# Patient Record
Sex: Female | Born: 1995 | Race: White | Hispanic: No | Marital: Married | Smoking: Never smoker
Health system: Southern US, Community
[De-identification: ages and names within clinical notes are randomized; demographics above are authoritative.]

## PROBLEM LIST (undated history)

## (undated) HISTORY — PX: NO PAST SURGERIES: SHX2092

## (undated) SURGERY — Surgical Case
Anesthesia: *Unknown

---

## 2016-04-21 ENCOUNTER — Ambulatory Visit: Admission: EM | Admit: 2016-04-21 | Discharge: 2016-04-21 | Discharge status: Absent without leave | Payer: Self-pay

## 2016-04-22 ENCOUNTER — Ambulatory Visit
Admission: EM | Admit: 2016-04-22 | Discharge: 2016-04-22 | Disposition: A | Discharge status: Home / Self Care | Payer: Self-pay | Attending: Family Medicine | Admitting: Family Medicine

## 2016-04-22 ENCOUNTER — Encounter: Payer: Self-pay | Admitting: *Deleted

## 2016-04-22 DIAGNOSIS — S61219A Laceration without foreign body of unspecified finger without damage to nail, initial encounter: Secondary | ICD-10-CM

## 2016-04-22 MED ORDER — TETANUS-DIPHTH-ACELL PERTUSSIS 5-2.5-18.5 LF-MCG/0.5 IM SUSP
0.5000 mL | Freq: Once | INTRAMUSCULAR | Status: AC
  Administered 2016-04-22: 0.5 mL via INTRAMUSCULAR

## 2016-04-22 NOTE — Discharge Instructions (Signed)
Stitches, Staples, or Adhesive Wound Closure  °Health care providers use stitches (sutures), staples, and certain glue (skin adhesives) to hold skin together while it heals (wound closure). You may need this treatment after you have surgery or if you cut your skin accidentally. These methods help your skin to heal more quickly and make it less likely that you will have a scar. A wound may take several months to heal completely.  °The type of wound you have determines when your wound gets closed. In most cases, the wound is closed as soon as possible (primary skin closure). Sometimes, closure is delayed so the wound can be cleaned and allowed to heal naturally. This reduces the chance of infection. Delayed closure may be needed if your wound:  °Is caused by a bite.  °Happened more than 6 hours ago.  °Involves loss of skin or the tissues under the skin.  °Has dirt or debris in it that cannot be removed.  °Is infected. °WHAT ARE THE DIFFERENT KINDS OF WOUND CLOSURES?  °There are many options for wound closure. The one that your health care provider uses depends on how deep and how large your wound is.  °Adhesive Glue  °To use this type of glue to close a wound, your health care provider holds the edges of the wound together and paints the glue on the surface of your skin. You may need more than one layer of glue. Then the wound may be covered with a light bandage (dressing).  °This type of skin closure may be used for small wounds that are not deep (superficial). Using glue for wound closure is less painful than other methods. It does not require a medicine that numbs the area (local anesthetic). This method also leaves nothing to be removed. Adhesive glue is often used for children and on facial wounds.  °Adhesive glue cannot be used for wounds that are deep, uneven, or bleeding. It is not used inside of a wound.  °Adhesive Strips  °These strips are made of sticky (adhesive), porous paper. They are applied across your  skin edges like a regular adhesive bandage. You leave them on until they fall off.  °Adhesive strips may be used to close very superficial wounds. They may also be used along with sutures to improve the closure of your skin edges.  °Sutures  °Sutures are the oldest method of wound closure. Sutures can be made from natural substances, such as silk, or from synthetic materials, such as nylon and steel. They can be made from a material that your body can break down as your wound heals (absorbable), or they can be made from a material that needs to be removed from your skin (nonabsorbable). They come in many different strengths and sizes.  °Your health care provider attaches the sutures to a steel needle on one end. Sutures can be passed through your skin, or through the tissues beneath your skin. Then they are tied and cut. Your skin edges may be closed in one continuous stitch or in separate stitches.  °Sutures are strong and can be used for all kinds of wounds. Absorbable sutures may be used to close tissues under the skin. The disadvantage of sutures is that they may cause skin reactions that lead to infection. Nonabsorbable sutures need to be removed.  °Staples  °When surgical staples are used to close a wound, the edges of your skin on both sides of the wound are brought close together. A staple is placed across the wound, and   an instrument secures the edges together. Staples are often used to close surgical cuts (incisions).  °Staples are faster to use than sutures, and they cause less skin reaction. Staples need to be removed using a tool that bends the staples away from your skin.  °HOW DO I CARE FOR MY WOUND CLOSURE?  °Take medicines only as directed by your health care provider.  °If you were prescribed an antibiotic medicine for your wound, finish it all even if you start to feel better.  °Use ointments or creams only as directed by your health care provider.  °Wash your hands with soap and water before and  after touching your wound.  °Do not soak your wound in water. Do not take baths, swim, or use a hot tub until your health care provider approves.  °Ask your health care provider when you can start showering. Cover your wound if directed by your health care provider.  °Do not take out your own sutures or staples.  °Do not pick at your wound. Picking can cause an infection.  °Keep all follow-up visits as directed by your health care provider. This is important. °HOW LONG WILL I HAVE MY WOUND CLOSURE?  °Leave adhesive glue on your skin until the glue peels away.  °Leave adhesive strips on your skin until the strips fall off.  °Absorbable sutures will dissolve within several days.  °Nonabsorbable sutures and staples must be removed. The location of the wound will determine how long they stay in. This can range from several days to a couple of weeks. °WHEN SHOULD I SEEK HELP FOR MY WOUND CLOSURE?  °Contact your health care provider if:  °You have a fever.  °You have chills.  °You have drainage, redness, swelling, or pain at your wound.  °There is a bad smell coming from your wound.  °The skin edges of your wound start to separate after your sutures have been removed.  °Your wound becomes thick, raised, and darker in color after your sutures come out (scarring). °This information is not intended to replace advice given to you by your health care provider. Make sure you discuss any questions you have with your health care provider.  °Document Released: 08/02/2001 Document Revised: 11/28/2014 Document Reviewed: 04/16/2014  °Elsevier Interactive Patient Education ©2016 [employer]  ° °

## 2016-04-22 NOTE — ED Notes (Signed)
1/2" laceration to tip right middle finger, occurred while opening a can of dog food yesterday evening. Bleeding controlled, no edema.

## 2016-04-22 NOTE — ED Provider Notes (Signed)
CSN: [id number]     Arrival date & time 04/22/16  0801 History   First MD Initiated Contact with Patient 04/22/16 [phone]     Chief Complaint  Patient presents with  . 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 She is cleaning it extensively prior to coming in but presented today because of the need of a tetanus injection.  History reviewed. No pertinent past medical history. History reviewed. No pertinent past surgical history. History reviewed. No pertinent family history. Social History  Substance Use Topics  . Smoking status: Never Smoker   . Smokeless tobacco: None  . Alcohol Use: No   OB History    No data available     Review of Systems  Constitutional: Negative for fever, chills, activity change and fatigue.  Skin: Positive for wound.  All other systems reviewed and are negative.   Allergies  Sulfa antibiotics and Penicillins  Home Medications   Prior to Admission medications   Medication Sig Start Date End Date Taking? Authorizing Provider  fluticasone (FLONASE) 50 MCG/ACT nasal spray Place into both nostrils daily.   Yes Historical Provider, MD  norgestrel-ethinyl estradiol (LO/OVRAL,CRYSELLE) 0.3-30 MG-MCG tablet Take 1 tablet by mouth daily.   Yes Historical Provider, MD   Meds Ordered and Administered this Visit   Medications  Tdap (BOOSTRIX) injection 0.5 mL (0.5 mLs Intramuscular Given 04/22/16 0829)    BP 118/76 mmHg  Pulse 101  Temp(Src) 98.5 F (36.9 C) (Oral)  Resp 16  Ht 5\' 6"  (1.676 m)  Wt 178 lb (80.74 kg)  BMI 28.74 kg/m2  SpO2 100%  LMP 04/22/2016 (Approximate) No data found.   Physical Exam  Constitutional: She is oriented to person, place, and time. She appears  well-developed and well-nourished. No distress.  HENT:  Head: Normocephalic and atraumatic.  Eyes: Conjunctivae are normal. Pupils are equal, round, and reactive to light.  Musculoskeletal: Normal range of motion. She exhibits no edema or tenderness.  Neurological: She is alert and oriented to person, place, and time.  Skin: Skin is warm and dry. She is not diaphoretic.  Examination of the right dominant hand shows a small 5 mm transversely oriented laceration over the distal pad not involving the fingernail which extends into the dermis exposing a small amount of subcutaneous tissue. Finger range of motion is normal and full. Sensation to light touch is intact. There is no drainage no bleeding no evidence of early infection present.  Psychiatric: She has a normal mood and affect. Her behavior is normal. Judgment and thought content normal.  Nursing note and vitals reviewed.   ED Course  Procedures (including critical care time)  Labs Review Labs Reviewed - No data to display  Imaging Review No results found.   Visual Acuity Review  Right Eye Distance:   Left Eye Distance:   Bilateral Distance:    Right Eye Near:   Left Eye Near:    Bilateral Near:     Medications  Tdap (BOOSTRIX) injection 0.5 mL (0.5 mLs Intramuscular Given 04/22/16 0829)   After thorough soaking and cleansing of the finger it was dried thoroughly and Dermabond was applied to the laceration. Her crit was applied and allowed to dry in between coats. Patient was instructed in signs and symptoms of infection and she'll return  to the clinic immediately if any occur. I have asked her to allow the glue to fall off normally and not to pick at it.   MDM   1. Laceration of finger of right hand, initial encounter    Plan: 1. Diagnosis reviewed with patient 2. rx as per orders; risks, benefits, potential side effects reviewed with patient 3. Recommend supportive treatment with Appropriate wound care. Information was  provided to the patient. 4. F/u prn if symptoms worsen or don't improve     Lutricia Feil, PA-C 04/22/16 0917  Lutricia Feil, PA-C 04/22/16 [phone]

## 2018-02-19 ENCOUNTER — Encounter: Payer: Self-pay | Admitting: Obstetrics & Gynecology

## 2018-02-19 ENCOUNTER — Ambulatory Visit: Payer: BLUE CROSS/BLUE SHIELD | Admitting: Obstetrics & Gynecology

## 2018-02-19 VITALS — BP 114/76 | HR 90 | Ht 66.0 in | Wt 195.0 lb

## 2018-02-19 DIAGNOSIS — N946 Dysmenorrhea, unspecified: Secondary | ICD-10-CM

## 2018-02-19 DIAGNOSIS — N921 Excessive and frequent menstruation with irregular cycle: Secondary | ICD-10-CM | POA: Diagnosis not present

## 2018-02-19 MED ORDER — MEGESTROL ACETATE 40 MG PO TABS: ORAL_TABLET | ORAL | 3 refills | Status: DC

## 2018-02-19 NOTE — Progress Notes (Signed)
Chief Complaint  Patient presents with  . Menstrual Problem    heavy periods and cramps      22 y.o. G0P0000 Patient's last menstrual period was 01/21/2018. The current method of family planning is OCP (estrogen/progesterone).  Outpatient Encounter Medications as of 02/19/2018  Medication Sig  . fluticasone (FLONASE) 50 MCG/ACT nasal spray Place into both nostrils daily.  . [DISCONTINUED] norgestrel-ethinyl estradiol (LO/OVRAL,CRYSELLE) 0.3-30 MG-MCG tablet Take 1 tablet by mouth daily.  . megestrol (MEGACE) 40 MG tablet 3 tablets a day for 5 days, 2 tablets a day for 5 days then 1 tablet daily   No facility-administered encounter medications on file as of 02/19/2018.     Subjective Several year history of heavy bleeding, soils, clots last 7+ days Heavy cramping as well OTCs do not work well anymore History reviewed. No pertinent past medical history.  History reviewed. No pertinent surgical history.  OB History    Gravida  0   Para  0   Term  0   Preterm  0   AB  0   Living  0     SAB  0   TAB  0   Ectopic  0   Multiple  0   Live Births  0           Allergies  Allergen Reactions  . Sulfa Antibiotics Anaphylaxis  . Penicillins Hives  . Latex Itching and Rash    Social History   Socioeconomic History  . Marital status: Single    Spouse name: Not on file  . Number of children: Not on file  . Years of education: Not on file  . Highest education level: Not on file  Occupational History  . Not on file  Social Needs  . Financial resource strain: Not on file  . Food insecurity:    Worry: Not on file    Inability: Not on file  . Transportation needs:    Medical: Not on file    Non-medical: Not on file  Tobacco Use  . Smoking status: Never Smoker  . Smokeless tobacco: Never Used  Substance and Sexual Activity  . Alcohol use: Yes    Comment: occ  . Drug use: Never  . Sexual activity: Yes    Birth control/protection: None  Lifestyle   . Physical activity:    Days per week: Not on file    Minutes per session: Not on file  . Stress: Not on file  Relationships  . Social connections:    Talks on phone: Not on file    Gets together: Not on file    Attends religious service: Not on file    Active member of club or organization: Not on file    Attends meetings of clubs or organizations: Not on file    Relationship status: Not on file  Other Topics Concern  . Not on file  Social History Narrative  . Not on file    Family History  Problem Relation Age of Onset  . Stroke Paternal Grandfather   . Hypertension Paternal Grandfather   . Cancer Paternal Grandmother        lung  . Breast cancer Paternal Grandmother   . Cancer Maternal Grandfather        renal  . Hypertension Maternal Grandfather   . Hypertension Father     Medications:       Current Outpatient Medications:  .  fluticasone (FLONASE) 50 MCG/ACT nasal spray, Place into  both nostrils daily., Disp: , Rfl:  .  desogestrel-ethinyl estradiol (APRI,EMOQUETTE,SOLIA) 0.15-30 MG-MCG tablet, Take 1 tablet by mouth daily., Disp: 1 Package, Rfl: 11 .  megestrol (MEGACE) 40 MG tablet, 3 tablets a day for 5 days, 2 tablets a day for 5 days then 1 tablet daily, Disp: 45 tablet, Rfl: 3 .  Norethindrone-Ethinyl Estradiol-Fe Biphas (LO LOESTRIN FE) 1 MG-10 MCG / 10 MCG tablet, Take 1 tablet by mouth daily., Disp: 1 Package, Rfl: 11  Objective Blood pressure 114/76, pulse 90, height 5\' 6"  (1.676 m), weight 195 lb (88.5 kg), last menstrual period 01/21/2018.  General WDWN female NAD Vulva:  normal appearing vulva with no masses, tenderness or lesions Vagina:  normal mucosa, no discharge Cervix:  no cervical motion tenderness and no lesions Uterus:  normal size, contour, position, consistency, mobility, non-tender Adnexa: ovaries:present,  normal adnexa in size, nontender and no masses   Pertinent ROS No burning with urination, frequency or urgency No nausea,  vomiting or diarrhea Nor fever chills or other constitutional symptoms'   Labs or studies     Impression Diagnoses this Encounter::   ICD-10-CM   1. Menometrorrhagia N92.1 US PELVIS TRANSVANGINAL NON-OB (TV ONLY)    US Pelvis Complete  2. Dysmenorrhea N94.6 US PELVIS TRANSVANGINAL NON-OB (TV ONLY)    US Pelvis Complete    Established relevant diagnosis(es):   Plan/Recommendations: Meds ordered this encounter  Medications  . megestrol (MEGACE) 40 MG tablet    Sig: 3 tablets a day for 5 days, 2 tablets a day for 5 days then 1 tablet daily    Dispense:  45 tablet    Refill:  3    Labs or Scans Ordered: Orders Placed This Encounter  Procedures  . US PELVIS TRANSVANGINAL NON-OB (TV ONLY)  . US Pelvis Complete    Management:: Megestrol algorithm Pelvic sonogram to delineate anatomy endometrium  Follow up Return in about 1 month (around 03/21/2018) for GYN sono, Follow up, with Dr Despina HiddenEure.      All questions were answered.

## 2018-03-19 ENCOUNTER — Ambulatory Visit: Payer: BLUE CROSS/BLUE SHIELD | Admitting: Obstetrics & Gynecology

## 2018-03-19 ENCOUNTER — Ambulatory Visit (INDEPENDENT_AMBULATORY_CARE_PROVIDER_SITE_OTHER): Payer: BLUE CROSS/BLUE SHIELD

## 2018-03-19 ENCOUNTER — Encounter: Payer: Self-pay | Admitting: Obstetrics & Gynecology

## 2018-03-19 VITALS — BP 118/80 | HR 96 | Ht 66.0 in | Wt 192.0 lb

## 2018-03-19 DIAGNOSIS — N921 Excessive and frequent menstruation with irregular cycle: Secondary | ICD-10-CM | POA: Diagnosis not present

## 2018-03-19 DIAGNOSIS — N946 Dysmenorrhea, unspecified: Secondary | ICD-10-CM

## 2018-03-19 MED ORDER — DESOGESTREL-ETHINYL ESTRADIOL 0.15-30 MG-MCG PO TABS: 1.0000 | ORAL_TABLET | Freq: Every day | ORAL | 11 refills | Status: DC

## 2018-03-19 NOTE — Progress Notes (Signed)
Follow up appointment for results  Chief Complaint  Patient presents with  . discuss [place]    Blood pressure 118/80, pulse 96, height  (1.676 m), weight 192 lb (87.1 kg), last menstrual period 01/23/2018.  US Pelvis Transvanginal Non-ob (tv Only)  Result Date: 03/19/2018 GYNECOLOGIC SONOGRAM Lamis Jocelyn Baldwin is a 22 y.o. G0P0000 LMP 01/23/2018,she is here for a pelvic sonogram for menometrorrhagia and dysmenorrhea. Uterus                      7.7 x 3.9 x 5 cm, vol 78 ml, homogeneous anteverted uterus,wnl Endometrium          5.8 mm, symmetrical, wnl Right ovary             3.4 x 2.7 x 3.6 cm, wnl Left ovary                3 x 2.5 x 1.2 cm, wnl No free fluid Technician Comments: PELVIC US TA/TV: homogeneous anteverted uterus,wnl,normal ovaries bilat,ovaries appear mobile,EEC 5.8 mm,no free fluid,no pain during ultrasound [employer] 03/19/2018 10:01 AM Clinical Impression and recommendations: I have reviewed the sonogram results above, combined with the patient's current clinical course, below are my impressions and any appropriate recommendations for management based on the sonographic findings. Normal uterus and endometrium Normal ovaries Lazaro Arms 03/19/2018 10:30 AM   US Pelvis Complete  Result Date: 03/19/2018 GYNECOLOGIC SONOGRAM Jocelyn Baldwin is a 22 y.o. G0P0000 LMP 01/23/2018,she is here for a pelvic sonogram for menometrorrhagia and dysmenorrhea. Uterus                      7.7 x 3.9 x 5 cm, vol 78 ml, homogeneous anteverted uterus,wnl Endometrium          5.8 mm, symmetrical, wnl Right ovary             3.4 x 2.7 x 3.6 cm, wnl Left ovary                3 x 2.5 x 1.2 cm, wnl No free fluid Technician Comments: PELVIC US TA/TV: homogeneous anteverted uterus,wnl,normal ovaries bilat,ovaries appear mobile,EEC 5.8 mm,no free fluid,no pain during ultrasound [employer] 03/19/2018 10:01 AM Clinical Impression and recommendations: I have reviewed the sonogram results above, combined with the  patient's current clinical course, below are my impressions and any appropriate recommendations for management based on the sonographic findings. Normal uterus and endometrium Normal ovaries Lazaro Arms 03/19/2018 10:30 AM      MEDS ordered this encounter: Meds ordered this encounter  Medications  . desogestrel-ethinyl estradiol (APRI,EMOQUETTE,SOLIA) 0.15-30 MG-MCG tablet    Sig: Take 1 tablet by mouth daily.    Dispense:  1 Package    Refill:  11    Orders for this encounter: No orders of the defined types were placed in this encounter.   Impression: Menometrorrhagia  Dysmenorrhea    Plan: Continue megestrol for 2 more months And will begin 30 mcg days Ogestrel birth control pill for cycle control Demitria is considering trying to start getting pregnant next year and I have instructed her she may need ovulation induction and she should have a quick trigger for [place] to use Clomid for her If she has problems in the meantime she will let me know  Follow Up: Return in about 1 year (around 03/20/2019) for yearly, with Dr Despina Hidden.       Face to face time:  15 minutes  Greater than 50% of the visit time was spent in counseling and coordination of care with the patient.  The summary and outline of the counseling and care coordination is summarized in the note above.   All questions were answered.  History reviewed. No pertinent past medical history.  History reviewed. No pertinent surgical history.  OB History    Gravida  0   Para  0   Term  0   Preterm  0   AB  0   Living  0     SAB  0   TAB  0   Ectopic  0   Multiple  0   Live Births  0           Allergies  Allergen Reactions  . Sulfa Antibiotics Anaphylaxis  . Penicillins Hives  . Latex Itching and Rash    Social History   Socioeconomic History  . Marital status: Single    Spouse name: Not on file  . Number of children: Not on file  . Years of education: Not on file  . Highest  education level: Not on file  Occupational History  . Not on file  Social Needs  . Financial resource strain: Not on file  . Food insecurity:    Worry: Not on file    Inability: Not on file  . Transportation needs:    Medical: Not on file    Non-medical: Not on file  Tobacco Use  . Smoking status: Never Smoker  . Smokeless tobacco: Never Used  Substance and Sexual Activity  . Alcohol use: Yes    Comment: occ  . Drug use: Never  . Sexual activity: Yes    Birth control/protection: None  Lifestyle  . Physical activity:    Days per week: Not on file    Minutes per session: Not on file  . Stress: Not on file  Relationships  . Social connections:    Talks on phone: Not on file    Gets together: Not on file    Attends religious service: Not on file    Active member of club or organization: Not on file    Attends meetings of clubs or organizations: Not on file    Relationship status: Not on file  Other Topics Concern  . Not on file  Social History Narrative  . Not on file    Family History  Problem Relation Age of Onset  . Stroke Paternal Grandfather   . Hypertension Paternal Grandfather   . Cancer Paternal Grandmother        lung  . Breast cancer Paternal Grandmother   . Cancer Maternal Grandfather        renal  . Hypertension Maternal Grandfather   . Hypertension Father

## 2018-03-19 NOTE — Progress Notes (Signed)
PELVIC US TA/TV: homogeneous anteverted uterus,wnl,normal ovaries bilat,ovaries appear mobile,EEC 5.8 mm,no free fluid,no pain during ultrasound

## 2018-04-09 ENCOUNTER — Telehealth: Payer: Self-pay | Admitting: Obstetrics & Gynecology

## 2018-04-09 NOTE — Telephone Encounter (Signed)
Patient called stating that Dr. Despina Hidden Placed her on a medication for a week to start her period. Pt states that she has not started her period what should she do next. Please contact pt

## 2018-04-09 NOTE — Telephone Encounter (Signed)
Spoke with pt. Pt was started on Megace. Last Megace taken was 04/01/18 to have a period. Pt hasn't started period. Pt is having sex and hasn't taken a pregnancy test. I advised to take a pregnancy test and let us know what result is. Pt voiced understanding. I tried to call pt back to ask if she had started on the birth control pills that was prescribed and voice mail was full so I couldn't leave a message. JSY

## 2018-04-11 ENCOUNTER — Telehealth: Payer: Self-pay | Admitting: Obstetrics & Gynecology

## 2018-04-11 NOTE — Telephone Encounter (Signed)
Mail box full @ 10:49 am. Unable to leave message. JSY

## 2018-04-12 NOTE — Telephone Encounter (Signed)
Spoke with pt. Pt states she started period Monday. Is still bleeding. Taking Megace. I advised to continue med as prescribed and call with any concerns. Pt voiced understanding. JSY

## 2018-05-31 ENCOUNTER — Telehealth: Payer: Self-pay | Admitting: Obstetrics & Gynecology

## 2018-05-31 ENCOUNTER — Telehealth: Payer: Self-pay | Admitting: *Deleted

## 2018-05-31 MED ORDER — NORETHIN-ETH ESTRAD-FE BIPHAS 1 MG-10 MCG / 10 MCG PO TABS: 1.0000 | ORAL_TABLET | Freq: Every day | ORAL | 11 refills | Status: DC

## 2018-05-31 NOTE — Telephone Encounter (Signed)
Patient called stating that she has been speaking with Tish today, Pt states she forgot to ask tish one more question. Please contact pt

## 2018-05-31 NOTE — Telephone Encounter (Signed)
Patient states she has been taking the BCP for 3 weeks and 5-6 days into taking them, she started having more frequent migraines.  She has a history of migraines and has seen a neurologist in the past, but thinks the BCP are causing them to be more frequent.  She wants to know if dosage needs to be changed. Please advise.

## 2018-05-31 NOTE — Telephone Encounter (Signed)
[name] that [name] was sent.

## 2018-05-31 NOTE — Telephone Encounter (Signed)
See routing note.

## 2018-05-31 NOTE — Telephone Encounter (Addendum)
Patient states insurance states she does not qualify for the [name] discount.  Pharmacy did state if the information was sent through epic, then it may go through. Can new order for [name] be sent with the following info on "note to pharmacy" line?  BIN# [id number] PCN#CN Rx Grp# [id number] RxID# [id number]

## 2018-06-08 ENCOUNTER — Telehealth: Payer: Self-pay | Admitting: Obstetrics & Gynecology

## 2018-06-08 MED ORDER — NORETHIN-ETH ESTRAD-FE BIPHAS 1 MG-10 MCG / 10 MCG PO TABS: 1.0000 | ORAL_TABLET | Freq: Every day | ORAL | 11 refills | Status: DC

## 2019-07-24 ENCOUNTER — Ambulatory Visit (INDEPENDENT_AMBULATORY_CARE_PROVIDER_SITE_OTHER): Payer: Medicaid Other | Admitting: Obstetrics and Gynecology

## 2019-07-24 ENCOUNTER — Other Ambulatory Visit (HOSPITAL_COMMUNITY)
Admission: RE | Admit: 2019-07-24 | Discharge: 2019-07-24 | Disposition: A | Discharge status: Home / Self Care | Payer: Medicaid Other | Source: Ambulatory Visit | Attending: Obstetrics and Gynecology | Admitting: Obstetrics and Gynecology

## 2019-07-24 ENCOUNTER — Other Ambulatory Visit: Payer: Self-pay

## 2019-07-24 ENCOUNTER — Encounter: Payer: Self-pay | Admitting: Obstetrics and Gynecology

## 2019-07-24 VITALS — BP 111/73 | HR 88 | Wt 178.0 lb

## 2019-07-24 DIAGNOSIS — Z124 Encounter for screening for malignant neoplasm of cervix: Secondary | ICD-10-CM

## 2019-07-24 DIAGNOSIS — Z3401 Encounter for supervision of normal first pregnancy, first trimester: Secondary | ICD-10-CM | POA: Diagnosis not present

## 2019-07-24 DIAGNOSIS — Z113 Encounter for screening for infections with a predominantly sexual mode of transmission: Secondary | ICD-10-CM | POA: Insufficient documentation

## 2019-07-24 DIAGNOSIS — N912 Amenorrhea, unspecified: Secondary | ICD-10-CM

## 2019-07-24 DIAGNOSIS — Z3201 Encounter for pregnancy test, result positive: Secondary | ICD-10-CM

## 2019-07-24 DIAGNOSIS — Z3689 Encounter for other specified antenatal screening: Secondary | ICD-10-CM

## 2019-07-24 DIAGNOSIS — Z3A01 Less than 8 weeks gestation of pregnancy: Secondary | ICD-10-CM | POA: Diagnosis not present

## 2019-07-24 DIAGNOSIS — Z3400 Encounter for supervision of normal first pregnancy, unspecified trimester: Secondary | ICD-10-CM

## 2019-07-24 LAB — POCT URINE PREGNANCY: Preg Test, Ur: POSITIVE — AB

## 2019-07-24 NOTE — Progress Notes (Signed)
NOB 

## 2019-07-24 NOTE — Progress Notes (Signed)
New Obstetric Patient H&P    Chief Complaint: "Desires prenatal care"   History of Present Illness: Patient is a 23 y.o. G1P0000 Not Hispanic or Latino female, presents with amenorrhea and positive home pregnancy test. Patient's last menstrual period was 06/15/2019 (exact date). and based on her  LMP, her EDD is Estimated Date of Delivery: 03/21/20 and her EGA is [redacted]w[redacted]d. Cycles are regular monthly. Her last pap smear was within the last year at an outside provider [facility])   She had a urine pregnancy test which was positive about a week ago. Her last menstrual period was normal. Since her LMP she claims she has experienced some mild food aversion. She denies vaginal bleeding. Her past medical history is noncontributory.   Since her LMP, she admits to the use of tobacco products  no She claims she has gained   no pounds since the start of her pregnancy.  There are cats in the home in the home  no  She admits close contact with children on a regular basis  yes (works at [employer]) She has had chicken pox in the past yes She has had Tuberculosis exposures, symptoms, or previously tested positive for TB   no Current or past history of domestic violence. no  Genetic Screening/Teratology Counseling: (Includes patient, baby's father, or anyone in either family with:)   1. Patient's age >/= 11 at [facility]  no 2. Thalassemia ([place], [place], Mediterranean, or Asian background): MCV<80  no 3. Neural tube defect (meningomyelocele, spina bifida, anencephaly)  no 4. Congenital heart defect  no  5. Down syndrome  no 6. Tay-Sachs (Jewish, [place]  no 7. Canavan's Disease  no 8. Sickle cell disease or trait (African)  no  9. Hemophilia or other blood disorders  no  10. Muscular dystrophy  no  11. Cystic fibrosis  no  12. Huntington's Chorea  no  13. Mental retardation/autism  no 14. Other inherited genetic or chromosomal disorder  no 15. Maternal metabolic disorder  (DM, PKU, etc)  no 16. Patient or FOB with a child with a birth defect not listed above no  16a. Patient or FOB with a birth defect themselves no 17. Recurrent pregnancy loss, or stillbirth  no  18. Any medications since LMP other than prenatal vitamins (include vitamins, supplements, OTC meds, drugs, alcohol)  no 19. Any other genetic/environmental exposure to discuss  no  Infection History:   1. Lives with someone with TB or TB exposed  no  2. Patient or partner has history of genital herpes  no 3. Rash or viral illness since LMP  no 4. History of STI (GC, CT, HPV, syphilis, HIV)  no 5. History of recent travel :  no  Other pertinent information:  no     Review of Systems:10 point review of systems negative unless otherwise noted in HPI  Past Medical History:  History reviewed. No pertinent past medical history.  Past Surgical History:  Past Surgical History:  Procedure Laterality Date  . NO PAST SURGERIES      Gynecologic History: Patient's last menstrual period was 06/15/2019 (exact date).  Obstetric History: G1P0000  Family History:  Family History  Problem Relation Age of Onset  . Stroke Paternal Grandfather   . Hypertension Paternal Grandfather   . Cancer Paternal Grandmother        lung  . Breast cancer Paternal Grandmother   . Cancer Maternal Grandfather        renal  . Hypertension  Maternal Grandfather   . Hypertension Father     Social History:  Social History   Socioeconomic History  . Marital status: Single    Spouse name: Not on file  . Number of children: Not on file  . Years of education: Not on file  . Highest education level: Not on file  Occupational History  . Not on file  Social Needs  . Financial resource strain: Not on file  . Food insecurity    Worry: Not on file    Inability: Not on file  . Transportation needs    Medical: Not on file    Non-medical: Not on file  Tobacco Use  . Smoking status: Never Smoker  . Smokeless  tobacco: Never Used  Substance and Sexual Activity  . Alcohol use: Yes    Comment: occ  . Drug use: Never  . Sexual activity: Yes    Birth control/protection: None  Lifestyle  . Physical activity    Days per week: Not on file    Minutes per session: Not on file  . Stress: Not on file  Relationships  . Social Herbalist on phone: Not on file    Gets together: Not on file    Attends religious service: Not on file    Active member of club or organization: Not on file    Attends meetings of clubs or organizations: Not on file    Relationship status: Not on file  . Intimate partner violence    Fear of current or ex partner: Not on file    Emotionally abused: Not on file    Physically abused: Not on file    Forced sexual activity: Not on file  Other Topics Concern  . Not on file  Social History Narrative  . Not on file    Allergies:  Allergies  Allergen Reactions  . Sulfa Antibiotics Anaphylaxis  . Penicillins Hives  . Latex Itching and Rash    Medications: Prior to Admission medications   Medication Sig Start Date End Date Taking? Authorizing Provider  fluticasone (FLONASE) 50 MCG/ACT nasal spray Place into both nostrils daily.   Yes [provider]    Physical Exam Vitals: Blood pressure 111/73, pulse 88, weight 178 lb (80.7 kg), last menstrual period 06/15/2019.  General: NAD HEENT: normocephalic, anicteric Thyroid: no enlargement, no palpable nodules Pulmonary: No increased work of breathing, CTAB Cardiovascular: RRR, distal pulses 2+ Abdomen: NABS, soft, non-tender, non-distended.  Umbilicus without lesions.  No hepatomegaly, splenomegaly or masses palpable. No evidence of hernia  Genitourinary:  External: Normal external female genitalia.  Normal urethral meatus, normal  Bartholin's and Skene's glands.    Vagina: Normal vaginal mucosa, no evidence of prolapse.    Cervix: Grossly normal in appearance, no bleeding  Uterus:  Non-enlarged,  mobile, normal contour.  No CMT  Adnexa: ovaries non-enlarged, no adnexal masses  Rectal: deferred Extremities: no edema, erythema, or tenderness Neurologic: Grossly intact Psychiatric: mood appropriate, affect full   Assessment: 23 y.o. G1P0000 at 105w4d presenting to initiate prenatal care  Plan: 1) Avoid alcoholic beverages. 2) Patient encouraged not to smoke.  3) Discontinue the use of all non-medicinal drugs and chemicals.  4) Take prenatal vitamins daily.  5) Nutrition, food safety (fish, cheese advisories, and high nitrite foods) and exercise discussed. 6) Hospital and practice style discussed with cross coverage system.  7) Genetic Screening, such as with 1st Trimester Screening, cell free fetal DNA, AFP testing, and Ultrasound, as well as  with amniocentesis and CVS as appropriate, is discussed with patient. At the conclusion of today's visit patient requested genetic testing 8) Patient is asked about travel to areas at risk for the Zika virus, and counseled to avoid travel and exposure to mosquitoes or sexual partners who may have themselves been exposed to the virus. Testing is discussed, and will be ordered as appropriate.   Vena AustriaAndreas Dajour [place], MD, Evern CoreFACOG [facility], [facility] 07/24/2019, 9:47 AM

## 2019-07-25 LAB — CERVICOVAGINAL ANCILLARY ONLY
Chlamydia: NEGATIVE
Neisseria Gonorrhea: NEGATIVE

## 2019-07-26 LAB — RPR+RH+ABO+RUB AB+AB SCR+CB...
Antibody Screen: NEGATIVE
HIV Screen 4th Generation wRfx: NONREACTIVE
Hematocrit: 42.6 % (ref 34.0–46.6)
Hemoglobin: 14.3 g/dL (ref 11.1–15.9)
Hepatitis B Surface Ag: NEGATIVE
MCH: 28.5 pg (ref 26.6–33.0)
MCHC: 33.6 g/dL (ref 31.5–35.7)
MCV: 85 fL (ref 79–97)
Platelets: 151 10*3/uL (ref 150–450)
RBC: 5.02 x10E6/uL (ref 3.77–5.28)
RDW: 12.1 % (ref 11.7–15.4)
RPR Ser Ql: NONREACTIVE
Rh Factor: POSITIVE
Rubella Antibodies, IGG: 1.58 index (ref >0.99)
Varicella zoster IgG: 176 index (ref >165)
WBC: 5.3 10*3/uL (ref 3.4–10.8)

## 2019-07-26 LAB — URINE CULTURE

## 2019-08-05 ENCOUNTER — Other Ambulatory Visit: Payer: Self-pay

## 2019-08-05 ENCOUNTER — Ambulatory Visit (INDEPENDENT_AMBULATORY_CARE_PROVIDER_SITE_OTHER): Payer: Medicaid Other | Admitting: Obstetrics and Gynecology

## 2019-08-05 ENCOUNTER — Ambulatory Visit (INDEPENDENT_AMBULATORY_CARE_PROVIDER_SITE_OTHER): Payer: Medicaid Other

## 2019-08-05 ENCOUNTER — Encounter: Payer: Self-pay | Admitting: Obstetrics and Gynecology

## 2019-08-05 VITALS — BP 118/74 | Wt 178.0 lb

## 2019-08-05 DIAGNOSIS — Z3A01 Less than 8 weeks gestation of pregnancy: Secondary | ICD-10-CM | POA: Diagnosis not present

## 2019-08-05 DIAGNOSIS — N8311 Corpus luteum cyst of right ovary: Secondary | ICD-10-CM | POA: Diagnosis not present

## 2019-08-05 DIAGNOSIS — Z3400 Encounter for supervision of normal first pregnancy, unspecified trimester: Secondary | ICD-10-CM | POA: Insufficient documentation

## 2019-08-05 DIAGNOSIS — O3481 Maternal care for other abnormalities of pelvic organs, first trimester: Secondary | ICD-10-CM

## 2019-08-05 DIAGNOSIS — O219 Vomiting of pregnancy, unspecified: Secondary | ICD-10-CM | POA: Insufficient documentation

## 2019-08-05 DIAGNOSIS — Z3689 Encounter for other specified antenatal screening: Secondary | ICD-10-CM

## 2019-08-05 MED ORDER — BONJESTA 20-20 MG PO TBCR: 1.0000 | EXTENDED_RELEASE_TABLET | Freq: Two times a day (BID) | ORAL | 3 refills | Status: DC

## 2019-08-05 NOTE — Progress Notes (Signed)
Routine Prenatal Care Visit  Subjective  Jocelyn NoonSamantha Blalock Baldwin is a 23 y.o. G1P0000 at 4470w2d being seen today for ongoing prenatal care.  She is currently monitored for the following issues for this low-risk pregnancy and has Nausea and vomiting during pregnancy and Supervision of normal first pregnancy, antepartum on their problem list.  ----------------------------------------------------------------------------------- Patient reports nausea without vomiting.  But, is getting worse.    . Vag. Bleeding: None.   . Leaking Fluid denies.  ----------------------------------------------------------------------------------- The following portions of the patient's history were reviewed and updated as appropriate: allergies, current medications, past family history, past medical history, past social history, past surgical history and problem list. Problem list updated.  Objective  Blood pressure 118/74, weight 178 lb (80.7 kg), last menstrual period 06/15/2019. Pregravid weight 178 lb (80.7 kg) Total Weight Gain 0 lb (0 kg) Urinalysis: Urine Protein    Urine Glucose    Fetal Status: Fetal Heart Rate (bpm): Present-normal         General:  Alert, oriented and cooperative. Patient is in no acute distress.  Skin: Skin is warm and dry. No rash noted.   Cardiovascular: Normal heart rate noted  Respiratory: Normal respiratory effort, no problems with respiration noted  Abdomen: Soft, gravid, appropriate for gestational age. Pain/Pressure: Absent     Pelvic:  Cervical exam deferred        Extremities: Normal range of motion.     Mental Status: Normal mood and affect. Normal behavior. Normal judgment and thought content.   Imaging Results [place] Ob Comp Less 14 Wks  Result Date: 08/05/2019 Patient Name: Jocelyn Baldwin DOB: Oct 24, 1996 MRN: [id number] ULTRASOUND REPORT Location: [facility] Date of Service: 08/05/2019 Indications:dating Findings: Mason JimSingleton intrauterine pregnancy is  visualized with a CRL consistent with 44764w3d gestation, giving an (U/S) EDD of 03/20/2020. The (U/S) EDD is consistent with the clinically established EDD of 03/21/2020. FHR: 149 BPM CRL measurement: 12.0 mm Yolk sac is visualized and appears normal and early anatomy is normal. Amnion: visualized and appears normal Right Ovary is normal in appearance. Left Ovary is normal appearance. Corpus luteal cyst:  Right ovary Survey of the adnexa demonstrates no adnexal masses. There is no free peritoneal fluid in the cul de sac. Impression: 1. 31764w3d Viable Singleton Intrauterine pregnancy by U/S. 2. (U/S) EDD is consistent with Clinically established EDD of 03/21/2020. Jocelyn Baldwin, RT There is a viable singleton gestation.  Detailed evaluation of the fetal anatomy is precluded by early gestational age.  It must be noted that a normal ultrasound particular at this early gestational age is unable to rule out fetal aneuploidy, risk of first trimester miscarriage, or anatomic birth defects. Jocelyn MohairStephen Rocklin Soderquist, MD, Jocelyn Baldwin [facility], [facility] 08/05/2019 1:39 PM      Assessment   23 y.o. G1P0000 at 2370w2d by  03/21/2020, by Last Menstrual Period presenting for routine prenatal visit  Plan   Pregnancy#1 Problems (from 06/15/19 to present)    Problem Noted Resolved   Nausea and vomiting during pregnancy 08/05/2019 by Conard NovakJackson, Sascha Baugher D, MD No   Overview Signed 08/05/2019  2:02 PM by Conard NovakJackson, Novis League D, MD    - Samara DeistBonjesta Baldwin at 7 weeks      Supervision of normal first pregnancy, antepartum 08/05/2019 by Conard NovakJackson, Wyvonne Carda D, MD No   Overview Signed 08/05/2019  2:02 PM by Conard NovakJackson, Shelvy Heckert D, MD    Clinic [facility] Prenatal Labs  Dating L=7 Blood type: O/Positive/-- (09/02 1034)   Genetic Screen AFP:  NIPS: [ ]  desires Antibody:Negative (09/02 1034)  Anatomic [place]  Rubella: 1.58 (09/02 1034) Varicella: @VZVIGG @  GTT Third trimester:  RPR: Non Reactive (09/02 1034)   Rhogam n/a HBsAg: Negative (09/02 1034)    TDaP vaccine                       Flu Shot: HIV: Non Reactive (09/02 1034)   Baby Food                                GBS:   Contraception  Pap:  CBB     CS/VBAC n/a   Support Person              Preterm labor symptoms and general obstetric precautions including but not limited to vaginal bleeding, contractions, leaking of fluid and fetal movement were reviewed in detail with the patient. Please refer to After Visit Summary for other counseling recommendations.   - Baldwin Jocelyn Baldwin. - NIPT at 10 weeks  Return in about 3 weeks (around 08/26/2019) for Routine Prenatal Appointment and labs for NIPT.  Prentice Docker, MD, [facility], [facility] 08/05/2019 2:00 PM

## 2019-08-26 ENCOUNTER — Other Ambulatory Visit: Payer: Self-pay

## 2019-08-26 ENCOUNTER — Encounter: Payer: Self-pay | Admitting: Maternal Newborn

## 2019-08-26 ENCOUNTER — Ambulatory Visit (INDEPENDENT_AMBULATORY_CARE_PROVIDER_SITE_OTHER): Payer: Medicaid Other | Admitting: Maternal Newborn

## 2019-08-26 VITALS — BP 100/60 | Wt 176.8 lb

## 2019-08-26 DIAGNOSIS — Z1379 Encounter for other screening for genetic and chromosomal anomalies: Secondary | ICD-10-CM

## 2019-08-26 DIAGNOSIS — Z3A10 10 weeks gestation of pregnancy: Secondary | ICD-10-CM

## 2019-08-26 DIAGNOSIS — Z3401 Encounter for supervision of normal first pregnancy, first trimester: Secondary | ICD-10-CM

## 2019-08-26 DIAGNOSIS — Z3400 Encounter for supervision of normal first pregnancy, unspecified trimester: Secondary | ICD-10-CM

## 2019-08-26 LAB — POCT URINALYSIS DIPSTICK OB
Glucose, UA: NEGATIVE
POC,PROTEIN,UA: NEGATIVE

## 2019-08-26 NOTE — Progress Notes (Signed)
    Routine Prenatal Care Visit  Subjective  Jocelyn Baldwin is a 23 y.o. G1P0000 at [redacted]w[redacted]d being seen today for ongoing prenatal care.  She is currently monitored for the following issues for this low-risk pregnancy and has Nausea and vomiting during pregnancy and Supervision of normal first pregnancy, antepartum on their problem list.  ----------------------------------------------------------------------------------- Patient reports nausea.  Diclegis is helping and she feels better in the evening, able to eat and drink.  Vag. Bleeding: None.   ----------------------------------------------------------------------------------- The following portions of the patient's history were reviewed and updated as appropriate: allergies, current medications, past family history, past medical history, past social history, past surgical history and problem list. Problem list updated.   Objective  Blood pressure 100/60, weight 176 lb 12.8 oz (80.2 kg), last menstrual period 06/15/2019. Pregravid weight 178 lb (80.7 kg) Total Weight Gain -1 lb 3.2 oz (-0.544 kg) Urinalysis: Urine dipstick shows negative for glucose, protein.  Fetal Status: Fetal Heart Rate (bpm): 166         General:  Alert, oriented and cooperative. Patient is in no acute distress.  Skin: Skin is warm and dry. No rash noted.   Cardiovascular: Normal heart rate noted  Respiratory: Normal respiratory effort, no problems with respiration noted  Abdomen: Soft, gravid, appropriate for gestational age. Pain/Pressure: Absent     Pelvic:  Cervical exam deferred        Extremities: Normal range of motion.  Edema: None  Mental Status: Normal mood and affect. Normal behavior. Normal judgment and thought content.     Assessment   23 y.o. G1P0000 at [redacted]w[redacted]d, EDD 03/21/2020 by Last Menstrual Period presenting for a routine prenatal visit.  Plan   Pregnancy#1 Problems (from 06/15/19 to present)    Problem Noted Resolved   Nausea  and vomiting during pregnancy 08/05/2019 by Will Bonnet, MD No   Overview Signed 08/05/2019  2:02 PM by Will Bonnet, MD    - [name] Rx at 7 weeks      Supervision of normal first pregnancy, antepartum 08/05/2019 by Will Bonnet, MD No   Overview Signed 08/05/2019  2:02 PM by Will Bonnet, MD    Clinic [facility] Prenatal Labs  Dating L=7 Blood type: O/Positive/-- (09/02 1034)   Genetic Screen AFP:     NIPS: [ ]  desires Antibody:Negative (09/02 1034)  Anatomic [place]  Rubella: 1.58 (09/02 1034) Varicella: @VZVIGG @  GTT Third trimester:  RPR: Non Reactive (09/02 1034)   Rhogam n/a HBsAg: Negative (09/02 1034)   TDaP vaccine                       Flu Shot: HIV: Non Reactive (09/02 1034)   Baby Food                                GBS:   Contraception  Pap:  CBB     CS/VBAC n/a   Support Person             MaterniT21 today, wants to receive gender results in envelope.   Please refer to After Visit Summary for other counseling recommendations.   Return in about 4 weeks (around 09/23/2019) for [place] telephone.  Avel Sensor, CNM 08/26/2019  8:30 AM

## 2019-08-26 NOTE — Patient Instructions (Signed)
First Trimester of Pregnancy The first trimester of pregnancy is from week 1 until the end of week 13 (months 1 through 3). A week after a sperm fertilizes an egg, the egg will implant on the wall of the uterus. This embryo will begin to develop into a baby. Genes from you and your partner will form the baby. The female genes will determine whether the baby will be a boy or a girl. At 6-8 weeks, the eyes and face will be formed, and the heartbeat can be seen on ultrasound. At the end of 12 weeks, all the baby's organs will be formed. Now that you are pregnant, you will want to do everything you can to have a healthy baby. Two of the most important things are to get good prenatal care and to follow your health care provider's instructions. Prenatal care is all the medical care you receive before the baby's birth. This care will help prevent, find, and treat any problems during the pregnancy and childbirth. Body changes during your first trimester Your body goes through many changes during pregnancy. The changes vary from woman to woman.  You may gain or lose a couple of pounds at first.  You may feel sick to your stomach (nauseous) and you may throw up (vomit). If the vomiting is uncontrollable, call your health care provider.  You may tire easily.  You may develop headaches that can be relieved by medicines. All medicines should be approved by your health care provider.  You may urinate more often. Painful urination may mean you have a bladder infection.  You may develop heartburn as a result of your pregnancy.  You may develop constipation because certain hormones are causing the muscles that push stool through your intestines to slow down.  You may develop hemorrhoids or swollen veins (varicose veins).  Your breasts may begin to grow larger and become tender. Your nipples may stick out more, and the tissue that surrounds them (areola) may become darker.  Your gums may bleed and may be  sensitive to brushing and flossing.  Dark spots or blotches (chloasma, mask of pregnancy) may develop on your face. This will likely fade after the baby is born.  Your menstrual periods will stop.  You may have a loss of appetite.  You may develop cravings for certain kinds of food.  You may have changes in your emotions from day to day, such as being excited to be pregnant or being concerned that something may go wrong with the pregnancy and baby.  You may have more vivid and strange dreams.  You may have changes in your hair. These can include thickening of your hair, rapid growth, and changes in texture. Some women also have hair loss during or after pregnancy, or hair that feels dry or thin. Your hair will most likely return to normal after your baby is born. What to expect at prenatal visits During a routine prenatal visit:  You will be weighed to make sure you and the baby are growing normally.  Your blood pressure will be taken.  Your abdomen will be measured to track your baby's growth.  The fetal heartbeat will be listened to between weeks 10 and 14 of your pregnancy.  Test results from any previous visits will be discussed. Your health care provider may ask you:  How you are feeling.  If you are feeling the baby move.  If you have had any abnormal symptoms, such as leaking fluid, bleeding, severe headaches, or abdominal   cramping.  If you are using any tobacco products, including cigarettes, chewing tobacco, and electronic cigarettes.  If you have any questions. Other tests that may be performed during your first trimester include:  Blood tests to find your blood type and to check for the presence of any previous infections. The tests will also be used to check for low iron levels (anemia) and protein on red blood cells (Rh antibodies). Depending on your risk factors, or if you previously had diabetes during pregnancy, you may have tests to check for high blood sugar  that affects pregnant women (gestational diabetes).  Urine tests to check for infections, diabetes, or protein in the urine.  An ultrasound to confirm the proper growth and development of the baby.  Fetal screens for spinal cord problems (spina bifida) and Down syndrome.  HIV (human immunodeficiency virus) testing. Routine prenatal testing includes screening for HIV, unless you choose not to have this test.  You may need other tests to make sure you and the baby are doing well. Follow these instructions at home: Medicines  Follow your health care provider's instructions regarding medicine use. Specific medicines may be either safe or unsafe to take during pregnancy.  Take a prenatal vitamin that contains at least 600 micrograms (mcg) of folic acid.  If you develop constipation, try taking a stool softener if your health care provider approves. Eating and drinking   Eat a balanced diet that includes fresh fruits and vegetables, whole grains, good sources of protein such as meat, eggs, or tofu, and low-fat dairy. Your health care provider will help you determine the amount of weight gain that is right for you.  Avoid raw meat and uncooked cheese. These carry germs that can cause birth defects in the baby.  Eating four or five small meals rather than three large meals a day may help relieve nausea and vomiting. If you start to feel nauseous, eating a few soda crackers can be helpful. Drinking liquids between meals, instead of during meals, also seems to help ease nausea and vomiting.  Limit foods that are high in fat and processed sugars, such as fried and sweet foods.  To prevent constipation: ? Eat foods that are high in fiber, such as fresh fruits and vegetables, whole grains, and beans. ? Drink enough fluid to keep your urine clear or pale yellow. Activity  Exercise only as directed by your health care provider. Most women can continue their usual exercise routine during  pregnancy. Try to exercise for 30 minutes at least 5 days a week. Exercising will help you: ? Control your weight. ? Stay in shape. ? Be prepared for labor and delivery.  Experiencing pain or cramping in the lower abdomen or lower back is a good sign that you should stop exercising. Check with your health care provider before continuing with normal exercises.  Try to avoid standing for long periods of time. Move your legs often if you must stand in one place for a long time.  Avoid heavy lifting.  Wear low-heeled shoes and practice good posture.  You may continue to have sex unless your health care provider tells you not to. Relieving pain and discomfort  Wear a good support bra to relieve breast tenderness.  Take warm sitz baths to soothe any pain or discomfort caused by hemorrhoids. Use hemorrhoid cream if your health care provider approves.  Rest with your legs elevated if you have leg cramps or low back pain.  If you develop varicose veins in   your legs, wear support hose. Elevate your feet for 15 minutes, 3-4 times a day. Limit salt in your diet. Prenatal care  Schedule your prenatal visits by the twelfth week of pregnancy. They are usually scheduled monthly at first, then more often in the last 2 months before delivery.  Write down your questions. Take them to your prenatal visits.  Keep all your prenatal visits as told by your health care provider. This is important. Safety  Wear your seat belt at all times when driving.  Make a list of emergency phone numbers, including numbers for family, friends, the hospital, and police and fire departments. General instructions  Ask your health care provider for a referral to a local prenatal education class. Begin classes no later than the beginning of month 6 of your pregnancy.  Ask for help if you have counseling or nutritional needs during pregnancy. Your health care provider can offer advice or refer you to specialists for help  with various needs.  Do not use hot tubs, steam rooms, or saunas.  Do not douche or use tampons or scented sanitary pads.  Do not cross your legs for long periods of time.  Avoid cat litter boxes and soil used by cats. These carry germs that can cause birth defects in the baby and possibly loss of the fetus by miscarriage or stillbirth.  Avoid all smoking, herbs, alcohol, and medicines not prescribed by your health care provider. Chemicals in these products affect the formation and growth of the baby.  Do not use any products that contain nicotine or tobacco, such as cigarettes and e-cigarettes. If you need help quitting, ask your health care provider. You may receive counseling support and other resources to help you quit.  Schedule a dentist appointment. At home, brush your teeth with a soft toothbrush and be gentle when you floss. Contact a health care provider if:  You have dizziness.  You have mild pelvic cramps, pelvic pressure, or nagging pain in the abdominal area.  You have persistent nausea, vomiting, or diarrhea.  You have a bad smelling vaginal discharge.  You have pain when you urinate.  You notice increased swelling in your face, hands, legs, or ankles.  You are exposed to fifth disease or chickenpox.  You are exposed to German measles (rubella) and have never had it. Get help right away if:  You have a fever.  You are leaking fluid from your vagina.  You have spotting or bleeding from your vagina.  You have severe abdominal cramping or pain.  You have rapid weight gain or loss.  You vomit blood or material that looks like coffee grounds.  You develop a severe headache.  You have shortness of breath.  You have any kind of trauma, such as from a fall or a car accident. Summary  The first trimester of pregnancy is from week 1 until the end of week 13 (months 1 through 3).  Your body goes through many changes during pregnancy. The changes vary from  woman to woman.  You will have routine prenatal visits. During those visits, your health care provider will examine you, discuss any test results you may have, and talk with you about how you are feeling. This information is not intended to replace advice given to you by your health care provider. Make sure you discuss any questions you have with your health care provider. Document Released: 11/01/2001 Document Revised: 10/20/2017 Document Reviewed: 10/19/2016 Elsevier Patient Education  2020 [employer]  

## 2019-08-26 NOTE — Progress Notes (Signed)
No problems.[email or web address] 

## 2019-08-31 LAB — MATERNIT 21 PLUS CORE, BLOOD
Fetal Fraction: 10
Result (T21): NEGATIVE
Trisomy 13 (Patau syndrome): NEGATIVE
Trisomy 18 (Edwards syndrome): NEGATIVE
Trisomy 21 (Down syndrome): NEGATIVE

## 2019-09-04 ENCOUNTER — Telehealth: Payer: Self-pay

## 2019-09-04 NOTE — Telephone Encounter (Signed)
Patient inquiring about MaterniT21 results drawn about a week ago. [phone]

## 2019-09-04 NOTE — Progress Notes (Signed)
Will you please prepare a gender results envelope for pick up? Thanks!

## 2019-09-10 ENCOUNTER — Other Ambulatory Visit: Payer: Self-pay

## 2019-09-10 MED ORDER — DICLEGIS 10-10 MG PO TBEC: 1.0000 | DELAYED_RELEASE_TABLET | Freq: Three times a day (TID) | ORAL | 1 refills | Status: DC

## 2019-09-10 NOTE — Telephone Encounter (Signed)
Refilled diclegis for pt as she will be out of it tomorrow

## 2019-09-23 ENCOUNTER — Other Ambulatory Visit: Payer: Self-pay

## 2019-09-23 ENCOUNTER — Ambulatory Visit (INDEPENDENT_AMBULATORY_CARE_PROVIDER_SITE_OTHER): Payer: Medicaid Other | Admitting: Maternal Newborn

## 2019-09-23 ENCOUNTER — Encounter: Payer: Self-pay | Admitting: Maternal Newborn

## 2019-09-23 VITALS — Wt 175.0 lb

## 2019-09-23 DIAGNOSIS — O219 Vomiting of pregnancy, unspecified: Secondary | ICD-10-CM

## 2019-09-23 DIAGNOSIS — Z3689 Encounter for other specified antenatal screening: Secondary | ICD-10-CM

## 2019-09-23 DIAGNOSIS — Z3400 Encounter for supervision of normal first pregnancy, unspecified trimester: Secondary | ICD-10-CM

## 2019-09-23 DIAGNOSIS — Z3A14 14 weeks gestation of pregnancy: Secondary | ICD-10-CM

## 2019-09-23 MED ORDER — PROMETHAZINE HCL 25 MG PO TABS: 25.0000 mg | ORAL_TABLET | Freq: Four times a day (QID) | ORAL | 2 refills | Status: DC | PRN

## 2019-09-23 NOTE — Progress Notes (Signed)
Virtual Visit via Telephone Note  I connected with Jocelyn Baldwin on 09/23/19 at  8:10 AM EST by telephone and verified that I am speaking with the correct person using two identifiers.  Location: Patient: Home Provider: Office   I discussed the limitations of performing an evaluation and management service by telephone and the availability of in person appointments. The patient expressed understanding and agreed to proceed.   Subjective  Jocelyn Baldwin is a 23 y.o. G1P0000 at [redacted]w[redacted]d being seen today for ongoing prenatal care.  She is currently monitored for the following issues for this low-risk pregnancy and has Nausea and vomiting during pregnancy and Supervision of normal first pregnancy, antepartum on their problem list.  ----------------------------------------------------------------------------------- Patient reports nausea. Timing has changed and it is mostly occurring in the evening/night. Does not have emesis when she takes Diclegis. Some mild intermittent round ligament pains. Vag. Bleeding: None.  No leaking of fluid.  ----------------------------------------------------------------------------------- The following portions of the patient's history were reviewed and updated as appropriate: allergies, current medications, past family history, past medical history, past social history, past surgical history and problem list. Problem list updated.   Objective  Weight 175 lb (79.4 kg), last menstrual period 06/15/2019. Pregravid weight 178 lb (80.7 kg) Total Weight Gain -3 lb (-1.361 kg)  Physical exam could not be performed as this was a telephone visit done during COVID-19 precautions.  Assessment   23 y.o. G1P0000 at [redacted]w[redacted]d, EDD 03/21/2020 by Last Menstrual Period presenting for a prenatal telephone visit.  Plan   Pregnancy#1 Problems (from 06/15/19 to present)    Problem Noted Resolved   Nausea and vomiting during pregnancy 08/05/2019 by Conard Novak, MD No   Overview Signed 08/05/2019  2:02 PM by Conard Novak, MD    - [name] Rx at 7 weeks      Supervision of normal first pregnancy, antepartum 08/05/2019 by Conard Novak, MD No   Overview Signed 08/05/2019  2:02 PM by Conard Novak, MD    Clinic [facility] Prenatal Labs  Dating L=7 Blood type: O/Positive/-- (09/02 1034)   Genetic Screen AFP:     NIPS: [ ]  desires Antibody:Negative (09/02 1034)  Anatomic 11-06-1988  Rubella: 1.58 (09/02 1034) Varicella: @VZVIGG @  GTT Third trimester:  RPR: Non Reactive (09/02 1034)   Rhogam n/a HBsAg: Negative (09/02 1034)   TDaP vaccine                       Flu Shot: HIV: Non Reactive (09/02 1034)   Baby Food                                GBS:   Contraception  Pap:  CBB     CS/VBAC n/a   Support Person             Will try Phenergan to see if she can get additional relief for evening nausea; Rx sent.  Anatomy scan next visit.  I discussed the assessment and treatment plan with the patient. The patient was provided an opportunity to ask questions and all were answered. The patient agreed with the plan and demonstrated an understanding of the instructions.   The patient was advised to call back or seek an in-person evaluation as needed.  I provided 5 minutes of non-face-to-face time during this encounter.  Please refer to After Visit Summary for other counseling recommendations.   Return  in about 4 weeks (around 10/21/2019) for ROB and anatomy scan.  Avel Sensor, CNM 09/23/2019  8:21 AM

## 2019-09-23 NOTE — Patient Instructions (Signed)

## 2019-09-23 NOTE — Progress Notes (Signed)
Phone visit today

## 2019-10-09 ENCOUNTER — Other Ambulatory Visit: Payer: Self-pay | Admitting: Maternal Newborn

## 2019-10-21 ENCOUNTER — Encounter: Payer: Medicaid Other | Admitting: Obstetrics and Gynecology

## 2019-10-21 ENCOUNTER — Other Ambulatory Visit: Payer: Medicaid Other

## 2019-10-25 ENCOUNTER — Other Ambulatory Visit: Payer: Self-pay

## 2019-10-25 MED ORDER — DICLEGIS 10-10 MG PO TBEC: DELAYED_RELEASE_TABLET | ORAL | 0 refills | Status: DC

## 2019-11-06 ENCOUNTER — Other Ambulatory Visit: Payer: Self-pay

## 2019-11-06 ENCOUNTER — Ambulatory Visit (INDEPENDENT_AMBULATORY_CARE_PROVIDER_SITE_OTHER): Payer: Medicaid Other | Admitting: Advanced Practice Midwife

## 2019-11-06 ENCOUNTER — Ambulatory Visit (INDEPENDENT_AMBULATORY_CARE_PROVIDER_SITE_OTHER): Payer: Medicaid Other

## 2019-11-06 ENCOUNTER — Encounter: Payer: Self-pay | Admitting: Advanced Practice Midwife

## 2019-11-06 VITALS — BP 114/70 | Wt 183.0 lb

## 2019-11-06 DIAGNOSIS — Z3689 Encounter for other specified antenatal screening: Secondary | ICD-10-CM

## 2019-11-06 DIAGNOSIS — Z3A20 20 weeks gestation of pregnancy: Secondary | ICD-10-CM

## 2019-11-06 DIAGNOSIS — Z363 Encounter for antenatal screening for malformations: Secondary | ICD-10-CM | POA: Diagnosis not present

## 2019-11-06 DIAGNOSIS — Z3402 Encounter for supervision of normal first pregnancy, second trimester: Secondary | ICD-10-CM

## 2019-11-06 DIAGNOSIS — Z3400 Encounter for supervision of normal first pregnancy, unspecified trimester: Secondary | ICD-10-CM

## 2019-11-06 NOTE — Progress Notes (Signed)
  Routine Prenatal Care Visit  Subjective  Jocelyn Baldwin is a 23 y.o. G1P0000 at [redacted]w[redacted]d being seen today for ongoing prenatal care.  She is currently monitored for the following issues for this low-risk pregnancy and has Nausea and vomiting during pregnancy and Supervision of normal first pregnancy, antepartum on their problem list.  ----------------------------------------------------------------------------------- Patient reports nausea is decreasing. She is still taking the diclegis with good success.    . Vag. Bleeding: None.  Movement: Present. Leaking Fluid denies.  ----------------------------------------------------------------------------------- The following portions of the patient's history were reviewed and updated as appropriate: allergies, current medications, past family history, past medical history, past social history, past surgical history and problem list. Problem list updated.  Objective  Blood pressure 114/70, weight 183 lb (83 kg), last menstrual period 06/15/2019. Pregravid weight 178 lb (80.7 kg) Total Weight Gain 5 lb (2.268 kg) Urinalysis: Urine Protein    Urine Glucose    Fetal Status: Fetal Heart Rate (bpm): 154   Movement: Present  Presentation: Vertex  General:  Alert, oriented and cooperative. Patient is in no acute distress.  Skin: Skin is warm and dry. No rash noted.   Cardiovascular: Normal heart rate noted  Respiratory: Normal respiratory effort, no problems with respiration noted  Abdomen: Soft, gravid, appropriate for gestational age. Pain/Pressure: Absent     Pelvic:  Cervical exam deferred        Extremities: Normal range of motion.  Edema: None  Mental Status: Normal mood and affect. Normal behavior. Normal judgment and thought content.   Assessment   23 y.o. G1P0000 at [redacted]w[redacted]d by  03/21/2020, by Last Menstrual Period presenting for routine prenatal visit  Plan   Pregnancy#1 Problems (from 06/15/19 to present)    Problem Noted  Resolved   Nausea and vomiting during pregnancy 08/05/2019 by Will Bonnet, MD No   Overview Signed 08/05/2019  2:02 PM by Will Bonnet, MD    - [name] Rx at 7 weeks      Supervision of normal first pregnancy, antepartum 08/05/2019 by Will Bonnet, MD No   Overview Addendum 09/23/2019  8:20 AM by Rexene Agent, [place] Prenatal Labs  Dating L=7 Blood type: O/Positive/-- (09/02 1034)   Genetic Screen AFP:     NIPS: Negative XX Antibody:Negative (09/02 1034)  Anatomic [place] Normal/complete/female Placenta anterior/11/06/19 Rubella: 1.58 (09/02 1034) Varicella: Immune  GTT 28 wk:  RPR: Non Reactive (09/02 1034)   Rhogam n/a HBsAg: Negative (09/02 1034)   Vaccines TDAP:                       Flu Shot: HIV: Non Reactive (09/02 1034)   Baby Food                                GBS:   Contraception  Pap:  CBB     CS/VBAC n/a   Support Person                Preterm labor symptoms and general obstetric precautions including but not limited to vaginal bleeding, contractions, leaking of fluid and fetal movement were reviewed in detail with the patient.    Return in about 4 weeks (around 12/04/2019) for telephone rob.  Rod Can, CNM 11/06/2019 10:29 AM

## 2019-11-06 NOTE — Progress Notes (Signed)
Anatomy scan today no vb. No lof.

## 2019-11-19 ENCOUNTER — Other Ambulatory Visit: Payer: Self-pay

## 2019-11-19 MED ORDER — DICLEGIS 10-10 MG PO TBEC: DELAYED_RELEASE_TABLET | ORAL | 0 refills | Status: DC

## 2019-11-22 NOTE — L&D Delivery Note (Addendum)
Vacuum Assisted Delivery Note Primary OB: [facility] Delivery Physician: Annamarie Major, MD Gestational Age: Full term Antepartum complications: Thrombocytopenia Intrapartum complications: None  A viable Female was delivered via vertex presentation. "Madyson" Apgars:6 ,9  Weight:  8 lb 4 oz .   Placenta status: delayed but spontaneous and Intact.  Cord: 3+ vessels;  with the following complications: none.  PREOPERATIVE DIAGNOSES: 1. Term pregnancy at [redacted]wks Gestational Age 4. Maternal Exhaustion    POSTOPERATIVE DIAGNOSES: 1. Term pregnancy at [redacted]wks Gestational Age 4. Live, Viable female infant 3. Maternal Exhaustion    OPERATION PERFORMED: Vacuum-Assisted Vaginal Delivery   SURGEON: Annamarie Major, MD   ANESTHESIA: Epidural   ESTIMATED BLOOD LOSS: 1600 cc after delivery of placenta and with the lacerations in the vagina Plan is for OR repair of laceration due to difficult visualization and patient discomfort   FINDINGS: Delivered a female infant weighing 8lbs 4oz with Apgars 6 and 9, three-vessel cord and normal placenta.   COMPLICATIONS: None   SITUATION:  The options for labor management at his time were discussed with the patient and her partner, as were the delivery options including a Kiwi vacuum delivery. The pros and cons and the risks of the vacuum delivery were discussed in detail, as were the alternative approaches. The patient and her partner decided to proceed with a Kiwi vacuum delivery.    DESCRIPTION OF THE PROCEDURE:    The baby's head was confirmed to be in the OA presentation with 100% effacement and +3 station. The bladder was drained. The vacuum was placed and the correct placement in front of the posterior fontanelle was confirmed digitally. With the patient's next contraction, the vacuum was inflated and a gentle pressure was used to assist with maternal pushes to deliver the baby's head. In total there were 1pulls and 0 pop-offs. The vacuum was released after the  first pull as head was delivered (outlet vacuum assisted delivery). No episiotomy or perineal lacerations encountered or needed.  After delivery of the head, the vacuum was deflated and removed. There was not any nuchal cord.  The anterior shoulder was delivered with gentle downward guidance followed by delivery of the posterior shoulder with gentle upward guidance. The infant was placed on the maternal chest.  The cord was clamped x2 and cut. The infant was handed to the NICU attendants.   Pitocin was added to the patient's IV fluids. The placenta delivered spontaneously, was intact and had a three-vessel cord. A vaginal inspection revealed no perineal laceration yet distal posterior vaginal. The laceration  was repaired with #2-0Vicryl suture in a normal fashion with local anesthesia, however needs further inspection and repair which will be done in the OR under anesthesia.    At the end of the delivery mom and baby were recovering in stable condition.  Sponge, instrument, and needle counts were correct times two.   Anesthesia:  epidural Episiotomy:  none Lacerations:  posterior vaginal wall laceration Suture Repair: 2.0 vicryl however due to deep nature of laceration and patient discomfort will finish repair in OR Est. Blood Loss (mL):  1000 mL  Mom to postpartum.  Baby to Couplet care / Skin to Skin.  Annamarie Major, MD, [facility], [facility] 03/17/2020  5:02 PM [phone]

## 2019-12-04 ENCOUNTER — Other Ambulatory Visit: Payer: Self-pay

## 2019-12-04 ENCOUNTER — Ambulatory Visit (INDEPENDENT_AMBULATORY_CARE_PROVIDER_SITE_OTHER): Payer: Medicaid Other | Admitting: Advanced Practice Midwife

## 2019-12-04 ENCOUNTER — Encounter: Payer: Self-pay | Admitting: Advanced Practice Midwife

## 2019-12-04 VITALS — Wt 189.0 lb

## 2019-12-04 DIAGNOSIS — Z3A24 24 weeks gestation of pregnancy: Secondary | ICD-10-CM

## 2019-12-04 DIAGNOSIS — Z3400 Encounter for supervision of normal first pregnancy, unspecified trimester: Secondary | ICD-10-CM

## 2019-12-04 DIAGNOSIS — Z113 Encounter for screening for infections with a predominantly sexual mode of transmission: Secondary | ICD-10-CM

## 2019-12-04 DIAGNOSIS — Z131 Encounter for screening for diabetes mellitus: Secondary | ICD-10-CM

## 2019-12-04 DIAGNOSIS — Z3402 Encounter for supervision of normal first pregnancy, second trimester: Secondary | ICD-10-CM | POA: Diagnosis not present

## 2019-12-04 DIAGNOSIS — Z130 Encounter for screening for diseases of the blood and blood-forming organs and certain disorders involving the immune mechanism: Secondary | ICD-10-CM

## 2019-12-04 NOTE — Progress Notes (Signed)
Routine Prenatal Care Visit- Virtual Visit  Subjective   Virtual Visit via Telephone Note  I connected with Jocelyn Baldwin on 12/04/19 at  3:10 PM EST by telephone and verified that I am speaking with the correct person using two identifiers.   I discussed the limitations, risks, security and privacy concerns of performing an evaluation and management service by telephone and the availability of in person appointments. I also discussed with the patient that there may be a patient responsible charge related to this service. The patient expressed understanding and agreed to proceed.  The patient was away from home I spoke with the patient from my  Office phone The names of people involved in this encounter were: Jocelyn Baldwin and myself Rod Can, CNM  Jocelyn Baldwin is a 24 y.o. G1P0000 at [redacted]w[redacted]d being seen today for ongoing prenatal care.  She is currently monitored for the following issues for this low-risk pregnancy and has Nausea and vomiting during pregnancy and Supervision of normal first pregnancy, antepartum on their problem list.  ----------------------------------------------------------------------------------- Patient reports no complaints.   Contractions: Not present. Vag. Bleeding: None.  Movement: Present. Denies leaking of fluid.  ----------------------------------------------------------------------------------- The following portions of the patient's history were reviewed and updated as appropriate: allergies, current medications, past family history, past medical history, past social history, past surgical history and problem list. Problem list updated.   Objective  Weight 189 lb (85.7 kg), last menstrual period 06/15/2019. Pregravid weight 178 lb (80.7 kg) Total Weight Gain 11 lb (4.99 kg) Urinalysis:      Fetal Status:     Movement: Present     Physical Exam could not be performed. Because of the COVID-19 outbreak this visit was  performed over the phone and not in person.   Assessment   24 y.o. G1P0000 at [redacted]w[redacted]d by  03/21/2020, by Last Menstrual Period presenting for routine prenatal visit  Plan   Pregnancy#1 Problems (from 06/15/19 to present)    Problem Noted Resolved   Nausea and vomiting during pregnancy 08/05/2019 by Will Bonnet, MD No   Overview Signed 08/05/2019  2:02 PM by Will Bonnet, MD    - [name] Rx at 7 weeks      Supervision of normal first pregnancy, antepartum 08/05/2019 by Will Bonnet, MD No   Overview Addendum 11/06/2019 10:36 AM by Rod Can, [place] Prenatal Labs  Dating L=7 Blood type: O/Positive/-- (09/02 1034)   Genetic Screen AFP:     NIPS: Negative XX Antibody:Negative (09/02 1034)  Anatomic [place] Complete/normal/female Placenta anterior/11/06/19 Rubella: 1.58 (09/02 1034) Varicella: Immune  GTT Third trimester:  RPR: Non Reactive (09/02 1034)   Rhogam n/a HBsAg: Negative (09/02 1034)   Vaccines TDAP:                       Flu Shot: HIV: Non Reactive (09/02 1034)   Baby Food                                GBS:   Contraception  Pap:  CBB     CS/VBAC n/a   Support Person                Gestational age appropriate obstetric precautions including but not limited to vaginal bleeding, contractions, leaking of fluid and fetal movement were reviewed in detail with the patient.     Follow Up  Instructions: Discussed preparation for 28 wk labs   I discussed the assessment and treatment plan with the patient. The patient was provided an opportunity to ask questions and all were answered. The patient agreed with the plan and demonstrated an understanding of the instructions.   The patient was advised to call back or seek an in-person evaluation if the symptoms worsen or if the condition fails to improve as anticipated.  I provided 10 minutes of non-face-to-face time during this encounter.  Return in about 4 weeks (around 01/01/2020) for 28 wk labs  and rob.   Tresea Mall, CNM [facility] 12/04/2019, 3:31 PM

## 2019-12-08 ENCOUNTER — Other Ambulatory Visit: Payer: Self-pay

## 2019-12-09 MED ORDER — DICLEGIS 10-10 MG PO TBEC: DELAYED_RELEASE_TABLET | ORAL | 0 refills | Status: DC

## 2019-12-30 ENCOUNTER — Other Ambulatory Visit: Payer: Self-pay

## 2019-12-31 ENCOUNTER — Other Ambulatory Visit: Payer: Self-pay

## 2019-12-31 MED ORDER — DICLEGIS 10-10 MG PO TBEC: DELAYED_RELEASE_TABLET | ORAL | 1 refills | Status: DC

## 2019-12-31 NOTE — Telephone Encounter (Signed)
Please advise 

## 2020-01-02 ENCOUNTER — Other Ambulatory Visit: Payer: Self-pay

## 2020-01-02 ENCOUNTER — Other Ambulatory Visit: Payer: Medicaid Other

## 2020-01-02 ENCOUNTER — Ambulatory Visit (INDEPENDENT_AMBULATORY_CARE_PROVIDER_SITE_OTHER): Payer: Medicaid Other | Admitting: Certified Nurse Midwife

## 2020-01-02 DIAGNOSIS — Z130 Encounter for screening for diseases of the blood and blood-forming organs and certain disorders involving the immune mechanism: Secondary | ICD-10-CM

## 2020-01-02 DIAGNOSIS — R109 Unspecified abdominal pain: Secondary | ICD-10-CM | POA: Diagnosis not present

## 2020-01-02 DIAGNOSIS — R3915 Urgency of urination: Secondary | ICD-10-CM | POA: Diagnosis not present

## 2020-01-02 DIAGNOSIS — Z113 Encounter for screening for infections with a predominantly sexual mode of transmission: Secondary | ICD-10-CM

## 2020-01-02 DIAGNOSIS — Z131 Encounter for screening for diabetes mellitus: Secondary | ICD-10-CM

## 2020-01-02 DIAGNOSIS — Z3400 Encounter for supervision of normal first pregnancy, unspecified trimester: Secondary | ICD-10-CM

## 2020-01-02 DIAGNOSIS — Z3A28 28 weeks gestation of pregnancy: Secondary | ICD-10-CM

## 2020-01-02 DIAGNOSIS — R350 Frequency of micturition: Secondary | ICD-10-CM | POA: Diagnosis not present

## 2020-01-02 LAB — POCT URINALYSIS DIPSTICK OB
Glucose, UA: NEGATIVE
POC,PROTEIN,UA: NEGATIVE

## 2020-01-02 LAB — POCT URINALYSIS DIPSTICK
Bilirubin, UA: NEGATIVE
Blood, UA: NEGATIVE
Glucose, UA: NEGATIVE
Ketones, UA: NEGATIVE
Nitrite, UA: NEGATIVE
Protein, UA: NEGATIVE
Spec Grav, UA: 1.01 (ref 1.010–1.025)
Urobilinogen, UA: 0.2 E.U./dL
pH, UA: 7.5 (ref 5.0–8.0)

## 2020-01-02 MED ORDER — NITROFURANTOIN MONOHYD MACRO 100 MG PO CAPS: 100.0000 mg | ORAL_CAPSULE | Freq: Two times a day (BID) | ORAL | 0 refills | Status: DC

## 2020-01-02 NOTE — Progress Notes (Signed)
ROB at 28wk5d: Good FM. Has been having urinary frequency, urgency x3 days. No hematuria or vaginal bleeding or contractions. Desires to breast feed. Having 28 week labs today. O positive blood type Urine dipstick positive for small leukocytes Abdomen soft and NT FHTs WNL. FH 28 cm  A: IUP at 28.5 weeks with possible UTI Desires to breast feed  P:  Urine culture sent Macrobid BID x 7 days  The following were addressed during this visit:  Breastfeeding Education - The importance of early skin-to-skin contact  - Rooming-in on a 24-hour basis  - Feeding on demand or baby-led feeding   Farrel Conners, CNM

## 2020-01-02 NOTE — Progress Notes (Signed)
C/o urinary freq, urgency, pressure if she doesn't go.rj

## 2020-01-02 NOTE — Lactation Note (Signed)
Lactation Consultation Note  Patient Name: Taelyn Broecker OIBBC'W Date: 01/02/2020    Lactation student provided patient with Education Checklist.  After review, patient denies any questions or clarifications needed.  Merrily Pew Sill assured that staff available should questions arise.   Maternal Data    Feeding    LATCH Score                   Interventions    Lactation Tools Discussed/Used     Consult Status      Corlis Hove - LC Student  01/02/2020, 9:29 AM

## 2020-01-03 LAB — 28 WEEK RH+PANEL
Basophils Absolute: 0 10*3/uL (ref 0.0–0.2)
Basos: 0 %
EOS (ABSOLUTE): 0 10*3/uL (ref 0.0–0.4)
Eos: 0 %
Gestational Diabetes Screen: 97 mg/dL (ref 65–139)
HIV Screen 4th Generation wRfx: NONREACTIVE
Hematocrit: 35.7 % (ref 34.0–46.6)
Hemoglobin: 12.2 g/dL (ref 11.1–15.9)
Immature Grans (Abs): 0.1 10*3/uL (ref 0.0–0.1)
Immature Granulocytes: 1 %
Lymphocytes Absolute: 1.5 10*3/uL (ref 0.7–3.1)
Lymphs: 17 %
MCH: 29.7 pg (ref 26.6–33.0)
MCHC: 34.2 g/dL (ref 31.5–35.7)
MCV: 87 fL (ref 79–97)
Monocytes Absolute: 0.7 10*3/uL (ref 0.1–0.9)
Monocytes: 8 %
Neutrophils Absolute: 6.6 10*3/uL (ref 1.4–7.0)
Neutrophils: 74 %
Platelets: 122 10*3/uL — ABNORMAL LOW (ref 150–450)
RBC: 4.11 x10E6/uL (ref 3.77–5.28)
RDW: 12.2 % (ref 11.7–15.4)
RPR Ser Ql: NONREACTIVE
WBC: 8.9 10*3/uL (ref 3.4–10.8)

## 2020-01-04 LAB — URINE CULTURE

## 2020-01-16 ENCOUNTER — Encounter: Payer: Self-pay | Admitting: Advanced Practice Midwife

## 2020-01-16 ENCOUNTER — Other Ambulatory Visit: Payer: Self-pay

## 2020-01-16 ENCOUNTER — Ambulatory Visit (INDEPENDENT_AMBULATORY_CARE_PROVIDER_SITE_OTHER): Payer: Medicaid Other | Admitting: Advanced Practice Midwife

## 2020-01-16 VITALS — BP 122/74 | Wt 206.0 lb

## 2020-01-16 DIAGNOSIS — Z3403 Encounter for supervision of normal first pregnancy, third trimester: Secondary | ICD-10-CM

## 2020-01-16 DIAGNOSIS — Z3400 Encounter for supervision of normal first pregnancy, unspecified trimester: Secondary | ICD-10-CM

## 2020-01-16 DIAGNOSIS — Z3A30 30 weeks gestation of pregnancy: Secondary | ICD-10-CM

## 2020-01-16 NOTE — Progress Notes (Signed)
No vb. No lof.  

## 2020-01-16 NOTE — Progress Notes (Signed)
Routine Prenatal Care Visit  Subjective  Jocelyn Baldwin is a 24 y.o. G1P0000 at [redacted]w[redacted]d being seen today for ongoing prenatal care.  She is currently monitored for the following issues for this low-risk pregnancy and has Nausea and vomiting during pregnancy and Supervision of normal first pregnancy, antepartum on their problem list.  ----------------------------------------------------------------------------------- Patient reports worsening tinea versicolor. She is wondering about oral anti fungal medication during pregnancy. We discussed best to avoid. She also uses topical anti fungal with some relief. We reviewed birth control options. She is undecided at this time but leaning towards progesterone only pill.   Contractions: Not present. Vag. Bleeding: None.  Movement: Present. Leaking Fluid denies.  ----------------------------------------------------------------------------------- The following portions of the patient's history were reviewed and updated as appropriate: allergies, current medications, past family history, past medical history, past social history, past surgical history and problem list. Problem list updated.  Objective  Blood pressure 122/74, weight 206 lb (93.4 kg), last menstrual period 06/15/2019. Pregravid weight 178 lb (80.7 kg) Total Weight Gain 28 lb (12.7 kg) Urinalysis: Urine Protein    Urine Glucose    Fetal Status: Fetal Heart Rate (bpm): 152 Fundal Height: 30 cm Movement: Present     General:  Alert, oriented and cooperative. Patient is in no acute distress.  Skin: Skin is warm and dry. No rash noted.   Cardiovascular: Normal heart rate noted  Respiratory: Normal respiratory effort, no problems with respiration noted  Abdomen: Soft, gravid, appropriate for gestational age. Pain/Pressure: Absent     Pelvic:  Cervical exam deferred        Extremities: Normal range of motion.  Edema: None  Mental Status: Normal mood and affect. Normal behavior.  Normal judgment and thought content.   Assessment   24 y.o. G1P0000 at [redacted]w[redacted]d by  03/21/2020, by Last Menstrual Period presenting for routine prenatal visit  Plan   Pregnancy#1 Problems (from 06/15/19 to present)    Problem Noted Resolved   Nausea and vomiting during pregnancy 08/05/2019 by Conard Novak, MD No   Overview Signed 08/05/2019  2:02 PM by Conard Novak, MD    - [name] Rx at 7 weeks      Supervision of normal first pregnancy, antepartum 08/05/2019 by Conard Novak, MD No   Overview Addendum 01/02/2020  9:24 AM by Farrel Conners, CNM    Clinic [facility] Prenatal Labs  Dating L=7 Blood type: O/Positive/-- (09/02 1034)   Genetic Screen AFP:     NIPS: Negative XX Antibody:Negative (09/02 1034)  Anatomic [place] Complete/normal/female Placenta anterior/11/06/19 Rubella: 1.58 (09/02 1034) Varicella: Immune  GTT Third trimester:  RPR: Non Reactive (09/02 1034)   Rhogam n/a HBsAg: Negative (09/02 1034)   Vaccines TDAP:                       Flu Shot: HIV: Non Reactive (09/02 1034)   Baby Food Breast                          GBS:   Contraception Considering POP Pap:  CBB     CS/VBAC n/a   Support Person                Preterm labor symptoms and general obstetric precautions including but not limited to vaginal bleeding, contractions, leaking of fluid and fetal movement were reviewed in detail with the patient. Please refer to After Visit Summary for other counseling recommendations.   Return in  about 2 weeks (around 01/30/2020) for rob.  Rod Can, CNM 01/16/2020 4:41 PM

## 2020-01-16 NOTE — Patient Instructions (Signed)
Third Trimester of Pregnancy The third trimester is from week 28 through week 40 (months 7 through 9). The third trimester is a time when the unborn baby (fetus) is growing rapidly. At the end of the ninth month, the fetus is about 20 inches in length and weighs 6-10 pounds. Body changes during your third trimester Your body will continue to go through many changes during pregnancy. The changes vary from woman to woman. During the third trimester:  Your weight will continue to increase. You can expect to gain 25-35 pounds (11-16 kg) by the end of the pregnancy.  You may begin to get stretch marks on your hips, abdomen, and breasts.  You may urinate more often because the fetus is moving lower into your pelvis and pressing on your bladder.  You may develop or continue to have heartburn. This is caused by increased hormones that slow down muscles in the digestive tract.  You may develop or continue to have constipation because increased hormones slow digestion and cause the muscles that push waste through your intestines to relax.  You may develop hemorrhoids. These are swollen veins (varicose veins) in the rectum that can itch or be painful.  You may develop swollen, bulging veins (varicose veins) in your legs.  You may have increased body aches in the pelvis, back, or thighs. This is due to weight gain and increased hormones that are relaxing your joints.  You may have changes in your hair. These can include thickening of your hair, rapid growth, and changes in texture. Some women also have hair loss during or after pregnancy, or hair that feels dry or thin. Your hair will most likely return to normal after your baby is born.  Your breasts will continue to grow and they will continue to become tender. A yellow fluid (colostrum) may leak from your breasts. This is the first milk you are producing for your baby.  Your belly button may stick out.  You may notice more swelling in your hands,  face, or ankles.  You may have increased tingling or numbness in your hands, arms, and legs. The skin on your belly may also feel numb.  You may feel short of breath because of your expanding uterus.  You may have more problems sleeping. This can be caused by the size of your belly, increased need to urinate, and an increase in your body's metabolism.  You may notice the fetus "dropping," or moving lower in your abdomen (lightening).  You may have increased vaginal discharge.  You may notice your joints feel loose and you may have pain around your pelvic bone. What to expect at prenatal visits You will have prenatal exams every 2 weeks until week 36. Then you will have weekly prenatal exams. During a routine prenatal visit:  You will be weighed to make sure you and the baby are growing normally.  Your blood pressure will be taken.  Your abdomen will be measured to track your baby's growth.  The fetal heartbeat will be listened to.  Any test results from the previous visit will be discussed.  You may have a cervical check near your due date to see if your cervix has softened or thinned (effaced).  You will be tested for Group B streptococcus. This happens between 35 and 37 weeks. Your health care provider may ask you:  What your birth plan is.  How you are feeling.  If you are feeling the baby move.  If you have had any abnormal   symptoms, such as leaking fluid, bleeding, severe headaches, or abdominal cramping.  If you are using any tobacco products, including cigarettes, chewing tobacco, and electronic cigarettes.  If you have any questions. Other tests or screenings that may be performed during your third trimester include:  Blood tests that check for low iron levels (anemia).  Fetal testing to check the health, activity level, and growth of the fetus. Testing is done if you have certain medical conditions or if there are problems during the pregnancy.  Nonstress test  (NST). This test checks the health of your baby to make sure there are no signs of problems, such as the baby not getting enough oxygen. During this test, a belt is placed around your belly. The baby is made to move, and its heart rate is monitored during movement. What is false labor? False labor is a condition in which you feel small, irregular tightenings of the muscles in the womb (contractions) that usually go away with rest, changing position, or drinking water. These are called Braxton Hicks contractions. Contractions may last for hours, days, or even weeks before true labor sets in. If contractions come at regular intervals, become more frequent, increase in intensity, or become painful, you should see your health care provider. What are the signs of labor?  Abdominal cramps.  Regular contractions that start at 10 minutes apart and become stronger and more frequent with time.  Contractions that start on the top of the uterus and spread down to the lower abdomen and back.  Increased pelvic pressure and dull back pain.  A watery or bloody mucus discharge that comes from the vagina.  Leaking of amniotic fluid. This is also known as your "water breaking." It could be a slow trickle or a gush. Let your health care provider know if it has a color or strange odor. If you have any of these signs, call your health care provider right away, even if it is before your due date. Follow these instructions at home: Medicines  Follow your health care provider's instructions regarding medicine use. Specific medicines may be either safe or unsafe to take during pregnancy.  Take a prenatal vitamin that contains at least 600 micrograms (mcg) of folic acid.  If you develop constipation, try taking a stool softener if your health care provider approves. Eating and drinking   Eat a balanced diet that includes fresh fruits and vegetables, whole grains, good sources of protein such as meat, eggs, or tofu,  and low-fat dairy. Your health care provider will help you determine the amount of weight gain that is right for you.  Avoid raw meat and uncooked cheese. These carry germs that can cause birth defects in the baby.  If you have low calcium intake from food, talk to your health care provider about whether you should take a daily calcium supplement.  Eat four or five small meals rather than three large meals a day.  Limit foods that are high in fat and processed sugars, such as fried and sweet foods.  To prevent constipation: ? Drink enough fluid to keep your urine clear or pale yellow. ? Eat foods that are high in fiber, such as fresh fruits and vegetables, whole grains, and beans. Activity  Exercise only as directed by your health care provider. Most women can continue their usual exercise routine during pregnancy. Try to exercise for 30 minutes at least 5 days a week. Stop exercising if you experience uterine contractions.  Avoid heavy lifting.  Do   not exercise in extreme heat or humidity, or at high altitudes.  Wear low-heel, comfortable shoes.  Practice good posture.  You may continue to have sex unless your health care provider tells you otherwise. Relieving pain and discomfort  Take frequent breaks and rest with your legs elevated if you have leg cramps or low back pain.  Take warm sitz baths to soothe any pain or discomfort caused by hemorrhoids. Use hemorrhoid cream if your health care provider approves.  Wear a good support bra to prevent discomfort from breast tenderness.  If you develop varicose veins: ? Wear support pantyhose or compression stockings as told by your healthcare provider. ? Elevate your feet for 15 minutes, 3-4 times a day. Prenatal care  Write down your questions. Take them to your prenatal visits.  Keep all your prenatal visits as told by your health care provider. This is important. Safety  Wear your seat belt at all times when driving.  Make  a list of emergency phone numbers, including numbers for family, friends, the hospital, and police and fire departments. General instructions  Avoid cat litter boxes and soil used by cats. These carry germs that can cause birth defects in the baby. If you have a cat, ask someone to clean the litter box for you.  Do not travel far distances unless it is absolutely necessary and only with the approval of your health care provider.  Do not use hot tubs, steam rooms, or saunas.  Do not drink alcohol.  Do not use any products that contain nicotine or tobacco, such as cigarettes and e-cigarettes. If you need help quitting, ask your health care provider.  Do not use any medicinal herbs or unprescribed drugs. These chemicals affect the formation and growth of the baby.  Do not douche or use tampons or scented sanitary pads.  Do not cross your legs for long periods of time.  To prepare for the arrival of your baby: ? Take prenatal classes to understand, practice, and ask questions about labor and delivery. ? Make a trial run to the hospital. ? Visit the hospital and tour the maternity area. ? Arrange for maternity or paternity leave through employers. ? Arrange for family and friends to take care of pets while you are in the hospital. ? Purchase a rear-facing car seat and make sure you know how to install it in your car. ? Pack your hospital bag. ? Prepare the baby's nursery. Make sure to remove all pillows and stuffed animals from the baby's crib to prevent suffocation.  Visit your dentist if you have not gone during your pregnancy. Use a soft toothbrush to brush your teeth and be gentle when you floss. Contact a health care provider if:  You are unsure if you are in labor or if your water has broken.  You become dizzy.  You have mild pelvic cramps, pelvic pressure, or nagging pain in your abdominal area.  You have lower back pain.  You have persistent nausea, vomiting, or  diarrhea.  You have an unusual or bad smelling vaginal discharge.  You have pain when you urinate. Get help right away if:  Your water breaks before 37 weeks.  You have regular contractions less than 5 minutes apart before 37 weeks.  You have a fever.  You are leaking fluid from your vagina.  You have spotting or bleeding from your vagina.  You have severe abdominal pain or cramping.  You have rapid weight loss or weight gain.  You have   shortness of breath with chest pain.  You notice sudden or extreme swelling of your face, hands, ankles, feet, or legs.  Your baby makes fewer than 10 movements in 2 hours.  You have severe headaches that do not go away when you take medicine.  You have vision changes. Summary  The third trimester is from week 28 through week 40, months 7 through 9. The third trimester is a time when the unborn baby (fetus) is growing rapidly.  During the third trimester, your discomfort may increase as you and your baby continue to gain weight. You may have abdominal, leg, and back pain, sleeping problems, and an increased need to urinate.  During the third trimester your breasts will keep growing and they will continue to become tender. A yellow fluid (colostrum) may leak from your breasts. This is the first milk you are producing for your baby.  False labor is a condition in which you feel small, irregular tightenings of the muscles in the womb (contractions) that eventually go away. These are called Braxton Hicks contractions. Contractions may last for hours, days, or even weeks before true labor sets in.  Signs of labor can include: abdominal cramps; regular contractions that start at 10 minutes apart and become stronger and more frequent with time; watery or bloody mucus discharge that comes from the vagina; increased pelvic pressure and dull back pain; and leaking of amniotic fluid. This information is not intended to replace advice given to you by your  health care provider. Make sure you discuss any questions you have with your health care provider. Document Revised: 02/28/2019 Document Reviewed: 12/13/2016 Elsevier Patient Education  2020 [employer]  

## 2020-01-29 ENCOUNTER — Other Ambulatory Visit: Payer: Self-pay

## 2020-01-29 ENCOUNTER — Ambulatory Visit (INDEPENDENT_AMBULATORY_CARE_PROVIDER_SITE_OTHER): Payer: Medicaid Other | Admitting: Advanced Practice Midwife

## 2020-01-29 ENCOUNTER — Encounter: Payer: Self-pay | Admitting: Advanced Practice Midwife

## 2020-01-29 VITALS — BP 100/60 | Wt 207.0 lb

## 2020-01-29 DIAGNOSIS — Z3A32 32 weeks gestation of pregnancy: Secondary | ICD-10-CM

## 2020-01-29 DIAGNOSIS — Z3400 Encounter for supervision of normal first pregnancy, unspecified trimester: Secondary | ICD-10-CM

## 2020-01-29 DIAGNOSIS — Z23 Encounter for immunization: Secondary | ICD-10-CM

## 2020-01-29 DIAGNOSIS — Z3403 Encounter for supervision of normal first pregnancy, third trimester: Secondary | ICD-10-CM

## 2020-01-29 LAB — POCT URINALYSIS DIPSTICK OB
Glucose, UA: NEGATIVE
POC,PROTEIN,UA: NEGATIVE

## 2020-01-29 NOTE — Progress Notes (Signed)
Routine Prenatal Care Visit  Subjective  Jocelyn Baldwin is a 24 y.o. G1P0000 at [redacted]w[redacted]d being seen today for ongoing prenatal care.  She is currently monitored for the following issues for this low-risk pregnancy and has Nausea and vomiting during pregnancy and Supervision of normal first pregnancy, antepartum on their problem list.  ----------------------------------------------------------------------------------- Patient reports history of SI joint pain due to dancing and was told by orthopedist it may mean she needs a c/s. Discussed it is unlikely to be a hard contraindication for vaginal delivery and suggested she talk with MD at one of her next visits regarding the issue.   Contractions: Not present. Vag. Bleeding: None.  Movement: Present. Leaking Fluid denies.  ----------------------------------------------------------------------------------- The following portions of the patient's history were reviewed and updated as appropriate: allergies, current medications, past family history, past medical history, past social history, past surgical history and problem list. Problem list updated.  Objective  Blood pressure 100/60, weight 207 lb (93.9 kg), last menstrual period 06/15/2019. Pregravid weight 178 lb (80.7 kg) Total Weight Gain 29 lb (13.2 kg) Urinalysis: Urine Protein    Urine Glucose    Fetal Status: Fetal Heart Rate (bpm): 134 Fundal Height: 33 cm Movement: Present     General:  Alert, oriented and cooperative. Patient is in no acute distress.  Skin: Skin is warm and dry. No rash noted.   Cardiovascular: Normal heart rate noted  Respiratory: Normal respiratory effort, no problems with respiration noted  Abdomen: Soft, gravid, appropriate for gestational age. Pain/Pressure: Present     Pelvic:  Cervical exam deferred        Extremities: Normal range of motion.     Mental Status: Normal mood and affect. Normal behavior. Normal judgment and thought content.    Assessment   24 y.o. G1P0000 at [redacted]w[redacted]d by  03/21/2020, by Last Menstrual Period presenting for routine prenatal visit  Plan   Pregnancy#1 Problems (from 06/15/19 to present)    Problem Noted Resolved   Nausea and vomiting during pregnancy 08/05/2019 by Will Bonnet, MD No   Overview Signed 08/05/2019  2:02 PM by Will Bonnet, MD    - Jocelyn Baldwin Rx at 7 weeks      Supervision of normal first pregnancy, antepartum 08/05/2019 by Will Bonnet, MD No   Overview Addendum 01/29/2020  4:52 PM by Rod Can, [facility] Prenatal Labs  Dating L=7 Blood type: O/Positive/-- (09/02 1034)   Genetic Screen AFP:     NIPS: Negative XX Antibody:Negative (09/02 1034)  Anatomic [place] Complete/normal/female Placenta anterior/11/06/19 Rubella: 1.58 (09/02 1034) Varicella: Immune  GTT Third trimester:  RPR: Non Reactive (09/02 1034)   Rhogam n/a HBsAg: Negative (09/02 1034)   Vaccines TDAP:  01/29/20                     Flu Shot: HIV: Non Reactive (09/02 1034)   Baby Food      Breast                          GBS:   Contraception Considering POP Pap:  CBB     CS/VBAC n/a   Support Person Husband Jocelyn Baldwin            TDAP today   Preterm labor symptoms and general obstetric precautions including but not limited to vaginal bleeding, contractions, leaking of fluid and fetal movement were reviewed in detail with the patient.   Return in  about 2 weeks (around 02/12/2020) for rob.  Tresea Mall, CNM 01/29/2020 4:53 PM

## 2020-02-03 ENCOUNTER — Other Ambulatory Visit: Payer: Self-pay | Admitting: Certified Nurse Midwife

## 2020-02-13 ENCOUNTER — Encounter: Payer: Self-pay | Admitting: Advanced Practice Midwife

## 2020-02-13 ENCOUNTER — Other Ambulatory Visit: Payer: Self-pay

## 2020-02-13 ENCOUNTER — Ambulatory Visit (INDEPENDENT_AMBULATORY_CARE_PROVIDER_SITE_OTHER): Payer: Medicaid Other | Admitting: Advanced Practice Midwife

## 2020-02-13 VITALS — BP 118/74 | Wt 210.0 lb

## 2020-02-13 DIAGNOSIS — Z3A34 34 weeks gestation of pregnancy: Secondary | ICD-10-CM

## 2020-02-13 DIAGNOSIS — Z3403 Encounter for supervision of normal first pregnancy, third trimester: Secondary | ICD-10-CM

## 2020-02-13 NOTE — Progress Notes (Signed)
No vb. No lof.  

## 2020-02-13 NOTE — Progress Notes (Addendum)
  Routine Prenatal Care Visit  Subjective  Jocelyn Baldwin is a 24 y.o. G1P0000 at [redacted]w[redacted]d being seen today for ongoing prenatal care.  She is currently monitored for the following issues for this low-risk pregnancy and has Nausea and vomiting during pregnancy and Supervision of normal first pregnancy, antepartum on their problem list.  ----------------------------------------------------------------------------------- Patient reports no complaints.   Contractions: Not present. Vag. Bleeding: None.  Movement: Present. Leaking Fluid denies.  ----------------------------------------------------------------------------------- The following portions of the patient's history were reviewed and updated as appropriate: allergies, current medications, past family history, past medical history, past social history, past surgical history and problem list. Problem list updated.  Objective  Blood pressure 118/74, weight 210 lb (95.3 kg), last menstrual period 06/15/2019. Pregravid weight 178 lb (80.7 kg) Total Weight Gain 32 lb (14.5 kg) Urinalysis: Urine Protein    Urine Glucose    Fetal Status: Fetal Heart Rate (bpm): 144 Fundal Height: 35 cm Movement: Present     General:  Alert, oriented and cooperative. Patient is in no acute distress.  Skin: Skin is warm and dry. No rash noted.   Cardiovascular: Normal heart rate noted  Respiratory: Normal respiratory effort, no problems with respiration noted  Abdomen: Soft, gravid, appropriate for gestational age. Pain/Pressure: Absent     Pelvic:  Cervical exam deferred        Extremities: Normal range of motion.  Edema: None  Mental Status: Normal mood and affect. Normal behavior. Normal judgment and thought content.   Assessment   24 y.o. G1P0000 at [redacted]w[redacted]d by  03/21/2020, by Last Menstrual Period presenting for routine prenatal visit  Plan   Pregnancy#1 Problems (from 06/15/19 to present)    Problem Noted Resolved   Nausea and vomiting during  pregnancy 08/05/2019 by Conard Novak, MD No   Overview Signed 08/05/2019  2:02 PM by Conard Novak, MD    - Jocelyn Baldwin Rx at 7 weeks      Supervision of normal first pregnancy, antepartum 08/05/2019 by Conard Novak, MD No   Overview Addendum 01/29/2020  4:52 PM by Tresea Mall, CNM    Clinic [facility] Prenatal Labs  Dating L=7 Blood type: O/Positive/-- (09/02 1034)   Genetic Screen AFP:     NIPS: Negative XX Antibody:Negative (09/02 1034)  Anatomic [place] Complete/normal/female Placenta anterior/11/06/19 Rubella: 1.58 (09/02 1034) Varicella: Immune  GTT Third trimester:  RPR: Non Reactive (09/02 1034)   Rhogam n/a HBsAg: Negative (09/02 1034)   Vaccines TDAP:  01/29/20                     Flu Shot: HIV: Non Reactive (09/02 1034)   Baby Food      Breast                          GBS:   Contraception Considering POP Pap:  CBB     CS/VBAC n/a   Support Person Husband Jocelyn Baldwin             GBS/aptima next visit  Recheck platelets next visit  Preterm labor symptoms and general obstetric precautions including but not limited to vaginal bleeding, contractions, leaking of fluid and fetal movement were reviewed in detail with the patient.    Return in about 2 weeks (around 02/27/2020) for rob.  Tresea Mall, CNM 02/13/2020 4:41 PM

## 2020-02-27 ENCOUNTER — Other Ambulatory Visit: Payer: Self-pay

## 2020-02-27 ENCOUNTER — Ambulatory Visit (INDEPENDENT_AMBULATORY_CARE_PROVIDER_SITE_OTHER): Payer: Medicaid Other | Admitting: Advanced Practice Midwife

## 2020-02-27 ENCOUNTER — Encounter: Payer: Self-pay | Admitting: Advanced Practice Midwife

## 2020-02-27 ENCOUNTER — Other Ambulatory Visit (HOSPITAL_COMMUNITY)
Admission: RE | Admit: 2020-02-27 | Discharge: 2020-02-27 | Disposition: A | Discharge status: Home / Self Care | Payer: Medicaid Other | Source: Ambulatory Visit | Attending: Advanced Practice Midwife | Admitting: Advanced Practice Midwife

## 2020-02-27 VITALS — BP 122/74 | Wt 213.0 lb

## 2020-02-27 DIAGNOSIS — Z113 Encounter for screening for infections with a predominantly sexual mode of transmission: Secondary | ICD-10-CM | POA: Insufficient documentation

## 2020-02-27 DIAGNOSIS — Z3400 Encounter for supervision of normal first pregnancy, unspecified trimester: Secondary | ICD-10-CM

## 2020-02-27 DIAGNOSIS — Z3A36 36 weeks gestation of pregnancy: Secondary | ICD-10-CM | POA: Insufficient documentation

## 2020-02-27 DIAGNOSIS — Z3685 Encounter for antenatal screening for Streptococcus B: Secondary | ICD-10-CM

## 2020-02-27 DIAGNOSIS — Z3403 Encounter for supervision of normal first pregnancy, third trimester: Secondary | ICD-10-CM | POA: Diagnosis present

## 2020-02-27 NOTE — Patient Instructions (Signed)
Pain Relief During Labor and Delivery Many things can cause pain during labor and delivery, including:  Pressure on bones and ligaments due to the baby moving through the pelvis.  Stretching of tissues due to the baby moving through the birth canal.  Muscle tension due to anxiety or nervousness.  The uterus tightening (contracting) and relaxing to help move the baby. There are many ways to deal with the pain of labor and delivery. They include:  Taking prenatal classes. Taking these classes helps you know what to expect during your baby's birth. What you learn will increase your confidence and decrease your anxiety.  Practicing relaxation techniques or doing relaxing activities, such as: ? Focused breathing. ? Meditation. ? Visualization. ? Aroma therapy. ? Listening to your favorite music. ? Hypnosis.  Taking a warm shower or bath (hydrotherapy). This may: ? Provide comfort and relaxation. ? Lessen your perception of pain. ? Decrease the amount of pain medicine needed. ? Decrease the length of labor.  Getting a massage or counterpressure on your back.  Applying warm packs or ice packs.  Changing positions often, moving around, or using a birthing ball.  Getting: ? Pain medicine through an IV or injection into a muscle. ? Pain medicine inserted into your spinal column. ? Injections of sterile water just under the skin on your lower back (intradermal injections). ? Laughing gas (nitrous oxide). Discuss your pain control options with your health care provider during your prenatal visits. Explore the options offered by your hospital or birth center. What kinds of medicine are available? There are two kinds of medicines that can be used to relieve pain during labor and delivery:  Analgesics. These medicines decrease pain without causing you to lose feeling or the ability to move your muscles.  Anesthetics. These medicines block feeling in the body and can decrease your  ability to move freely. Both of these kinds of medicine can cause minor side effects, such as nausea, trouble concentrating, and sleepiness. They can also decrease the baby's heart rate before birth and affect the baby's breathing rate after birth. For this reason, health care providers are careful about when and how much medicine is given. What are specific medicines and procedures that provide pain relief? Local Anesthetics Local anesthetics are used to numb a small area of the body. They may be used along with another kind of anesthetic or used to numb the nerves of the vagina, cervix, and perineum during the second stage of labor. General Anesthetics General anesthetics cause you to lose consciousness so you do not feel pain. They are usually only used for an emergency cesarean delivery. General anesthetics are given through an IV tube and a mask. Pudendal Block A pudendal block is a form of local anesthetic. It may be used to relieve the pain associated with pushing or stretching of the perineum at the time of delivery or to further numb the perineum. A pudendal block is done by injecting numbing medicine through the vaginal wall into a nerve in the pelvis. Epidural Analgesia Epidural analgesia is given through a flexible IV catheter that is inserted into the lower back. Numbing medicine is delivered continuously to the area near your spinal column nerves (epidural space). After having this type of analgesia, you may be able to move your legs but you most likely will not be able to walk. Depending on the amount of medicine given, you may lose all feeling in the lower half of your body, or you may retain some level   of sensation, including the urge to push. Epidural analgesia can be used to provide pain relief for a vaginal birth. Spinal Block A spinal block is similar to epidural analgesia, but the medicine is injected into the spinal fluid instead of the epidural space. A spinal block is only given  once. It starts to relieve pain quickly, but the pain relief lasts only 1-6 hours. Spinal blocks can be used for cesarean deliveries. Combined Spinal-Epidural (CSE) Block A CSE block combines the effects of a spinal block and epidural analgesia. The spinal block works quickly to block all pain. The epidural analgesia provides continuous pain relief, even after the effects of the spinal block have worn off. This information is not intended to replace advice given to you by your health care provider. Make sure you discuss any questions you have with your health care provider. Document Revised: 10/20/2017 Document Reviewed: 03/30/2016 Elsevier Patient Education  2020 [employer] of the uterus can occur throughout pregnancy, but they are not always a sign that you are in labor. You may have practice contractions called Braxton Hicks contractions. These false labor contractions are sometimes confused with true labor. What are Deberah Pelton contractions? Braxton Hicks contractions are tightening movements that occur in the muscles of the uterus before labor. Unlike true labor contractions, these contractions do not result in opening (dilation) and thinning of the cervix. Toward the end of pregnancy (32-34 weeks), Braxton Hicks contractions can happen more often and may become stronger. These contractions are sometimes difficult to tell apart from true labor because they can be very uncomfortable. You should not feel embarrassed if you go to the hospital with false labor. Sometimes, the only way to tell if you are in true labor is for your health care provider to look for changes in the cervix. The health care provider will do a physical exam and may monitor your contractions. If you are not in true labor, the exam should show that your cervix is not dilating and your water has not broken. If there are no other health problems associated with your pregnancy, it is  completely safe for you to be sent home with false labor. You may continue to have Braxton Hicks contractions until you go into true labor. How to tell the difference between true labor and false labor True labor  Contractions last 30-70 seconds.  Contractions become very regular.  Discomfort is usually felt in the top of the uterus, and it spreads to the lower abdomen and low back.  Contractions do not go away with walking.  Contractions usually become more intense and increase in frequency.  The cervix dilates and gets thinner. False labor  Contractions are usually shorter and not as strong as true labor contractions.  Contractions are usually irregular.  Contractions are often felt in the front of the lower abdomen and in the groin.  Contractions may go away when you walk around or change positions while lying down.  Contractions get weaker and are shorter-lasting as time goes on.  The cervix usually does not dilate or become thin. Follow these instructions at home:   Take over-the-counter and prescription medicines only as told by your health care provider.  Keep up with your usual exercises and follow other instructions from your health care provider.  Eat and drink lightly if you think you are going into labor.  If Braxton Hicks contractions are making you uncomfortable: ? Change your position from lying down or resting to walking,  or change from walking to resting. ? Sit and rest in a tub of warm water. ? Drink enough fluid to keep your urine pale yellow. Dehydration may cause these contractions. ? Do slow and deep breathing several times an hour.  Keep all follow-up prenatal visits as told by your health care provider. This is important. Contact a health care provider if:  You have a fever.  You have continuous pain in your abdomen. Get help right away if:  Your contractions become stronger, more regular, and closer together.  You have fluid leaking or  gushing from your vagina.  You pass blood-tinged mucus (bloody show).  You have bleeding from your vagina.  You have low back pain that you never had before.  You feel your baby's head pushing down and causing pelvic pressure.  Your baby is not moving inside you as much as it used to. Summary  Contractions that occur before labor are called Braxton Hicks contractions, false labor, or practice contractions.  Braxton Hicks contractions are usually shorter, weaker, farther apart, and less regular than true labor contractions. True labor contractions usually become progressively stronger and regular, and they become more frequent.  Manage discomfort from Braxton Hicks contractions by changing position, resting in a warm bath, drinking plenty of water, or practicing deep breathing. This information is not intended to replace advice given to you by your health care provider. Make sure you discuss any questions you have with your health care provider. Document Revised: 10/20/2017 Document Reviewed: 03/23/2017 Elsevier Patient Education  2020 Elsevier Inc[employer]  

## 2020-02-27 NOTE — Progress Notes (Signed)
No vb. No lof. GBS/Aptima today.  

## 2020-02-27 NOTE — Progress Notes (Addendum)
Routine Prenatal Care Visit  Subjective  Jocelyn Baldwin is a 24 y.o. G1P0000 at [redacted]w[redacted]d being seen today for ongoing prenatal care.  She is currently monitored for the following issues for this low-risk pregnancy and has Nausea and vomiting during pregnancy and Supervision of normal first pregnancy, antepartum on their problem list.  ----------------------------------------------------------------------------------- Patient reports no complaints.  We discussed pain relief in labor, elective induction vs spontaneous labor vs postdates iol. Patient prefers iol at 41 weeks if no labor by then.  Contractions: Not present. Vag. Bleeding: None.  Movement: Present. Leaking Fluid denies.  ----------------------------------------------------------------------------------- The following portions of the patient's history were reviewed and updated as appropriate: allergies, current medications, past family history, past medical history, past social history, past surgical history and problem list. Problem list updated.  Objective  Blood pressure 122/74, weight 213 lb (96.6 kg), last menstrual period 06/15/2019. Pregravid weight 178 lb (80.7 kg) Total Weight Gain 35 lb (15.9 kg) Urinalysis: Urine Protein    Urine Glucose    Fetal Status: Fetal Heart Rate (bpm): 148 Fundal Height: 36 cm Movement: Present     General:  Alert, oriented and cooperative. Patient is in no acute distress.  Skin: Skin is warm and dry. No rash noted.   Cardiovascular: Normal heart rate noted  Respiratory: Normal respiratory effort, no problems with respiration noted  Abdomen: Soft, gravid, appropriate for gestational age. Pain/Pressure: Absent     Pelvic:  Cervical exam performed Dilation: Closed    GBS/aptima collected  Extremities: Normal range of motion.  Edema: None  Mental Status: Normal mood and affect. Normal behavior. Normal judgment and thought content.   The following were addressed during this  visit:  Breastfeeding Education - Early initiation of breastfeeding    Comments: Keeps milk supply adequate, helps contract uterus and slow bleeding, and early milk is the perfect first food and is easy to digest.   - The importance of exclusive breastfeeding    Comments: Provides antibodies, Lower risk of breast and ovarian cancers, and type-2 diabetes,Helps your body recover, Reduced chance of SIDS.   - Risks of giving your baby anything other than breast milk if you are breastfeeding    Comments: Make the baby less content with breastfeeds, may make my baby more susceptible to illness, and may reduce my milk supply.   - Frequent feeding to help assure optimal milk production    Comments: Making a full supply of milk requires frequent removal of milk from breasts, infant will eat 8-12 times in 24 hours, if separated from infant use breast massage, hand expression and/ or pumping to remove milk from breasts.   - Effective positioning and attachment    Comments: Helps my baby to get enough breast milk, helps to produce an adequate milk supply, and helps prevent nipple pain and damage   - Exclusive breastfeeding for the first 6 months    Comments: Builds a healthy milk supply and keeps it up, protects baby from sickness and disease, and breastmilk has everything your baby needs for the first 6 months.    Assessment   24 y.o. G1P0000 at [redacted]w[redacted]d by  03/21/2020, by Last Menstrual Period presenting for routine prenatal visit  Plan   Pregnancy#1 Problems (from 06/15/19 to present)    Problem Noted Resolved   Nausea and vomiting during pregnancy 08/05/2019 by Will Bonnet, MD No   Overview Signed 08/05/2019  2:02 PM by Will Bonnet, MD    - [name] Rx at 7 weeks  Supervision of normal first pregnancy, antepartum 08/05/2019 by Conard Novak, MD No   Overview Addendum 01/29/2020  4:52 PM by Tresea Mall, CNM    Clinic [facility] Prenatal Labs  Dating L=7 Blood type:  O/Positive/-- (09/02 1034)   Genetic Screen AFP:     NIPS: Negative XX Antibody:Negative (09/02 1034)  Anatomic [place] Complete/normal/female Placenta anterior/11/06/19 Rubella: 1.58 (09/02 1034) Varicella: Immune  GTT Third trimester:  RPR: Non Reactive (09/02 1034)   Rhogam n/a HBsAg: Negative (09/02 1034)   Vaccines TDAP:  01/29/20                     Flu Shot: HIV: Non Reactive (09/02 1034)   Baby Food      Breast                          GBS:   Contraception Considering POP Pap:  CBB     CS/VBAC n/a   Support Person Husband Evan            CBC to check platelets today   Preterm labor symptoms and general obstetric precautions including but not limited to vaginal bleeding, contractions, leaking of fluid and fetal movement were reviewed in detail with the patient. Please refer to After Visit Summary for other counseling recommendations.   Return in about 1 week (around 03/05/2020) for rob.  Tresea Mall, CNM 02/27/2020 8:31 AM

## 2020-02-28 LAB — CBC WITH DIFFERENTIAL/PLATELET
Basophils Absolute: 0 10*3/uL (ref 0.0–0.2)
Basos: 0 %
EOS (ABSOLUTE): 0.1 10*3/uL (ref 0.0–0.4)
Eos: 1 %
Hematocrit: 39.2 % (ref 34.0–46.6)
Hemoglobin: 13.2 g/dL (ref 11.1–15.9)
Immature Grans (Abs): 0 10*3/uL (ref 0.0–0.1)
Immature Granulocytes: 1 %
Lymphocytes Absolute: 1.4 10*3/uL (ref 0.7–3.1)
Lymphs: 16 %
MCH: 30.2 pg (ref 26.6–33.0)
MCHC: 33.7 g/dL (ref 31.5–35.7)
MCV: 90 fL (ref 79–97)
Monocytes Absolute: 0.7 10*3/uL (ref 0.1–0.9)
Monocytes: 8 %
Neutrophils Absolute: 6.5 10*3/uL (ref 1.4–7.0)
Neutrophils: 74 %
Platelets: 96 10*3/uL — CL (ref 150–450)
RBC: 4.37 x10E6/uL (ref 3.77–5.28)
RDW: 13 % (ref 11.7–15.4)
WBC: 8.7 10*3/uL (ref 3.4–10.8)

## 2020-03-02 LAB — CERVICOVAGINAL ANCILLARY ONLY
Chlamydia: NEGATIVE
Comment: NEGATIVE
Comment: NEGATIVE
Comment: NORMAL
Neisseria Gonorrhea: NEGATIVE
Trichomonas: NEGATIVE

## 2020-03-02 LAB — STREP GP B CULTURE+RFLX: Strep Gp B Culture+Rflx: NEGATIVE

## 2020-03-05 ENCOUNTER — Other Ambulatory Visit: Payer: Self-pay

## 2020-03-05 ENCOUNTER — Encounter: Payer: Self-pay | Admitting: Obstetrics and Gynecology

## 2020-03-05 ENCOUNTER — Ambulatory Visit (INDEPENDENT_AMBULATORY_CARE_PROVIDER_SITE_OTHER): Payer: Medicaid Other | Admitting: Obstetrics and Gynecology

## 2020-03-05 VITALS — BP 122/82 | Wt 210.0 lb

## 2020-03-05 DIAGNOSIS — Z3403 Encounter for supervision of normal first pregnancy, third trimester: Secondary | ICD-10-CM

## 2020-03-05 DIAGNOSIS — Z3A37 37 weeks gestation of pregnancy: Secondary | ICD-10-CM

## 2020-03-05 DIAGNOSIS — Z3400 Encounter for supervision of normal first pregnancy, unspecified trimester: Secondary | ICD-10-CM

## 2020-03-05 LAB — POCT URINALYSIS DIPSTICK OB: Glucose, UA: NEGATIVE

## 2020-03-05 NOTE — Progress Notes (Signed)
    Routine Prenatal Care Visit  Subjective  Jocelyn Baldwin is a 24 y.o. G1P0000 at [redacted]w[redacted]d being seen today for ongoing prenatal care.  She is currently monitored for the following issues for this low-risk pregnancy and has Nausea and vomiting during pregnancy and Supervision of normal first pregnancy, antepartum on their problem list.  ----------------------------------------------------------------------------------- Patient reports no complaints.   Contractions: Not present. Vag. Bleeding: None.  Movement: Present. Denies leaking of fluid.  ----------------------------------------------------------------------------------- The following portions of the patient's history were reviewed and updated as appropriate: allergies, current medications, past family history, past medical history, past social history, past surgical history and problem list. Problem list updated.   Objective  Blood pressure 122/82, weight 210 lb (95.3 kg), last menstrual period 06/15/2019. Pregravid weight 178 lb (80.7 kg) Total Weight Gain 32 lb (14.5 kg) Urinalysis:      Fetal Status: Fetal Heart Rate (bpm): 154 Fundal Height: 37 cm Movement: Present     General:  Alert, oriented and cooperative. Patient is in no acute distress.  Skin: Skin is warm and dry. No rash noted.   Cardiovascular: Normal heart rate noted  Respiratory: Normal respiratory effort, no problems with respiration noted  Abdomen: Soft, gravid, appropriate for gestational age. Pain/Pressure: Present     Pelvic:  Cervical exam performed        Extremities: Normal range of motion.  Edema: None  Mental Status: Normal mood and affect. Normal behavior. Normal judgment and thought content.     Assessment   24 y.o. G1P0000 at [redacted]w[redacted]d by  03/21/2020, by Last Menstrual Period presenting for routine prenatal visit  Plan   Pregnancy#1 Problems (from 06/15/19 to present)    Problem Noted Resolved   Nausea and vomiting during pregnancy  08/05/2019 by Conard Novak, MD No   Overview Signed 08/05/2019  2:02 PM by Conard Novak, MD    - [name] Rx at 7 weeks      Supervision of normal first pregnancy, antepartum 08/05/2019 by Conard Novak, MD No   Overview Addendum 01/29/2020  4:52 PM by Tresea Mall, CNM    [facility]inic [facility] Prenatal Labs  Dating L=7 Blood type: O/Positive/-- (09/02 1034)   Genetic Screen AFP:     NIPS: Negative XX Antibody:Negative (09/02 1034)  Anatomic [place] Complete/normal/female Placenta anterior/11/06/19 Rubella: 1.58 (09/02 1034) Varicella: Immune  GTT Third trimester:  RPR: Non Reactive (09/02 1034)   Rhogam n/a HBsAg: Negative (09/02 1034)   Vaccines TDAP:  01/29/20                     Flu Shot: HIV: Non Reactive (09/02 1034)   Baby Food      Breast                          GBS:   Contraception Considering POP Pap:  CBB     CS/VBAC n/a   Support Person Husband Clayburn Pert              Discussed options for 39 week IOL if desires, she will consider.   Gestational age appropriate obstetric precautions including but not limited to vaginal bleeding, contractions, leaking of fluid and fetal movement were reviewed in detail with the patient.    Return in about 1 week (around 03/12/2020) for ROB with MD .  Natale Milch MD [facility], [facility] 03/05/2020, 11:23 AM

## 2020-03-05 NOTE — Patient Instructions (Signed)
Pain Relief During Labor and Delivery Many things can cause pain during labor and delivery, including:  Pressure on bones and ligaments due to the baby moving through the pelvis.  Stretching of tissues due to the baby moving through the birth canal.  Muscle tension due to anxiety or nervousness.  The uterus tightening (contracting) and relaxing to help move the baby. There are many ways to deal with the pain of labor and delivery. They include:  Taking prenatal classes. Taking these classes helps you know what to expect during your baby's birth. What you learn will increase your confidence and decrease your anxiety.  Practicing relaxation techniques or doing relaxing activities, such as: ? Focused breathing. ? Meditation. ? Visualization. ? Aroma therapy. ? Listening to your favorite music. ? Hypnosis.  Taking a warm shower or bath (hydrotherapy). This may: ? Provide comfort and relaxation. ? Lessen your perception of pain. ? Decrease the amount of pain medicine needed. ? Decrease the length of labor.  Getting a massage or counterpressure on your back.  Applying warm packs or ice packs.  Changing positions often, moving around, or using a birthing ball.  Getting: ? Pain medicine through an IV or injection into a muscle. ? Pain medicine inserted into your spinal column. ? Injections of sterile water just under the skin on your lower back (intradermal injections). ? Laughing gas (nitrous oxide). Discuss your pain control options with your health care provider during your prenatal visits. Explore the options offered by your hospital or birth center. What kinds of medicine are available? There are two kinds of medicines that can be used to relieve pain during labor and delivery:  Analgesics. These medicines decrease pain without causing you to lose feeling or the ability to move your muscles.  Anesthetics. These medicines block feeling in the body and can decrease your  ability to move freely. Both of these kinds of medicine can cause minor side effects, such as nausea, trouble concentrating, and sleepiness. They can also decrease the baby's heart rate before birth and affect the baby's breathing rate after birth. For this reason, health care providers are careful about when and how much medicine is given. What are specific medicines and procedures that provide pain relief? Local Anesthetics Local anesthetics are used to numb a small area of the body. They may be used along with another kind of anesthetic or used to numb the nerves of the vagina, cervix, and perineum during the second stage of labor. General Anesthetics General anesthetics cause you to lose consciousness so you do not feel pain. They are usually only used for an emergency cesarean delivery. General anesthetics are given through an IV tube and a mask. Pudendal Block A pudendal block is a form of local anesthetic. It may be used to relieve the pain associated with pushing or stretching of the perineum at the time of delivery or to further numb the perineum. A pudendal block is done by injecting numbing medicine through the vaginal wall into a nerve in the pelvis. Epidural Analgesia Epidural analgesia is given through a flexible IV catheter that is inserted into the lower back. Numbing medicine is delivered continuously to the area near your spinal column nerves (epidural space). After having this type of analgesia, you may be able to move your legs but you most likely will not be able to walk. Depending on the amount of medicine given, you may lose all feeling in the lower half of your body, or you may retain some level   of sensation, including the urge to push. Epidural analgesia can be used to provide pain relief for a vaginal birth. Spinal Block A spinal block is similar to epidural analgesia, but the medicine is injected into the spinal fluid instead of the epidural space. A spinal block is only given  once. It starts to relieve pain quickly, but the pain relief lasts only 1-6 hours. Spinal blocks can be used for cesarean deliveries. Combined Spinal-Epidural (CSE) Block A CSE block combines the effects of a spinal block and epidural analgesia. The spinal block works quickly to block all pain. The epidural analgesia provides continuous pain relief, even after the effects of the spinal block have worn off. This information is not intended to replace advice given to you by your health care provider. Make sure you discuss any questions you have with your health care provider. Document Revised: 10/20/2017 Document Reviewed: 03/30/2016 Elsevier Patient Education  2020 [employer] Vaginal Delivery  Vaginal delivery means that you give birth by pushing your baby out of your birth canal (vagina). A team of health care providers will help you before, during, and after vaginal delivery. Birth experiences are unique for every woman and every pregnancy, and birth experiences vary depending on where you choose to give birth. What happens when I arrive at the birth center or hospital? Once you are in labor and have been admitted into the hospital or birth center, your health care provider may:  Review your pregnancy history and any concerns that you have.  Insert an IV into one of your veins. This may be used to give you fluids and medicines.  Check your blood pressure, pulse, temperature, and heart rate (vital signs).  Check whether your bag of water (amniotic sac) has broken (ruptured).  Talk with you about your birth plan and discuss pain control options. Monitoring Your health care provider may monitor your contractions (uterine monitoring) and your baby's heart rate (fetal monitoring). You may need to be monitored:  Often, but not continuously (intermittently).  All the time or for long periods at a time (continuously). Continuous monitoring may be needed if: ? You are taking certain medicines,  such as medicine to relieve pain or make your contractions stronger. ? You have pregnancy or labor complications. Monitoring may be done by:  Placing a special stethoscope or a handheld monitoring device on your abdomen to check your baby's heartbeat and to check for contractions.  Placing monitors on your abdomen (external monitors) to record your baby's heartbeat and the frequency and length of contractions.  Placing monitors inside your uterus through your vagina (internal monitors) to record your baby's heartbeat and the frequency, length, and strength of your contractions. Depending on the type of monitor, it may remain in your uterus or on your baby's head until birth.  Telemetry. This is a type of continuous monitoring that can be done with external or internal monitors. Instead of having to stay in bed, you are able to move around during telemetry. Physical exam Your health care provider may perform frequent physical exams. This may include:  Checking how and where your baby is positioned in your uterus.  Checking your cervix to determine: ? Whether it is thinning out (effacing). ? Whether it is opening up (dilating). What happens during labor and delivery?  Normal labor and delivery is divided into the following three stages: Stage 1  This is the longest stage of labor.  This stage can last for hours or days.  Throughout this stage,   you will feel contractions. Contractions generally feel mild, infrequent, and irregular at first. They get stronger, more frequent (about every 2-3 minutes), and more regular as you move through this stage.  This stage ends when your cervix is completely dilated to 4 inches (10 cm) and completely effaced. Stage 2  This stage starts once your cervix is completely effaced and dilated and lasts until the delivery of your baby.  This stage may last from 20 minutes to 2 hours.  This is the stage where you will feel an urge to push your baby out of  your vagina.  You may feel stretching and burning pain, especially when the widest part of your baby's head passes through the vaginal opening (crowning).  Once your baby is delivered, the umbilical cord will be clamped and cut. This usually occurs after waiting a period of 1-2 minutes after delivery.  Your baby will be placed on your bare chest (skin-to-skin contact) in an upright position and covered with a warm blanket. Watch your baby for feeding cues, like rooting or sucking, and help the baby to your breast for his or her first feeding. Stage 3  This stage starts immediately after the birth of your baby and ends after you deliver the placenta.  This stage may take anywhere from 5 to 30 minutes.  After your baby has been delivered, you will feel contractions as your body expels the placenta and your uterus contracts to control bleeding. What can I expect after labor and delivery?  After labor is over, you and your baby will be monitored closely until you are ready to go home to ensure that you are both healthy. Your health care team will teach you how to care for yourself and your baby.  You and your baby will stay in the same room (rooming in) during your hospital stay. This will encourage early bonding and successful breastfeeding.  You may continue to receive fluids and medicines through an IV.  Your uterus will be checked and massaged regularly (fundal massage).  You will have some soreness and pain in your abdomen, vagina, and the area of skin between your vaginal opening and your anus (perineum).  If an incision was made near your vagina (episiotomy) or if you had some vaginal tearing during delivery, cold compresses may be placed on your episiotomy or your tear. This helps to reduce pain and swelling.  You may be given a squirt bottle to use instead of wiping when you go to the bathroom. To use the squirt bottle, follow these steps: ? Before you urinate, fill the squirt  bottle with warm water. Do not use hot water. ? After you urinate, while you are sitting on the toilet, use the squirt bottle to rinse the area around your urethra and vaginal opening. This rinses away any urine and blood. ? Fill the squirt bottle with clean water every time you use the bathroom.  It is normal to have vaginal bleeding after delivery. Wear a sanitary pad for vaginal bleeding and discharge. Summary  Vaginal delivery means that you will give birth by pushing your baby out of your birth canal (vagina).  Your health care provider may monitor your contractions (uterine monitoring) and your baby's heart rate (fetal monitoring).  Your health care provider may perform a physical exam.  Normal labor and delivery is divided into three stages.  After labor is over, you and your baby will be monitored closely until you are ready to go home. This information   is not intended to replace advice given to you by your health care provider. Make sure you discuss any questions you have with your health care provider. Document Revised: 12/12/2017 Document Reviewed: 12/12/2017 Elsevier Patient Education  2020 [employer] Labor Induction  Labor induction is when steps are taken to cause a pregnant woman to begin the labor process. Most women go into labor on their own between 37 weeks and 42 weeks of pregnancy. When this does not happen or when there is a medical need for labor to begin, steps may be taken to induce labor. Labor induction causes a pregnant woman's uterus to contract. It also causes the cervix to soften (ripen), open (dilate), and thin out (efface). Usually, labor is not induced before 39 weeks of pregnancy unless there is a medical reason to do so. Your health care provider will determine if labor induction is needed. Before inducing labor, your health care provider will consider a number of factors, including:  Your medical condition and your baby's.  How many weeks along you  are in your pregnancy.  How mature your baby's lungs are.  The condition of your cervix.  The position of your baby.  The size of your birth canal. What are some reasons for labor induction? Labor may be induced if:  Your health or your baby's health is at risk.  Your pregnancy is overdue by 1 week or more.  Your water breaks but labor does not start on its own.  There is a low amount of amniotic fluid around your baby. You may also choose (elect) to have labor induced at a certain time. Generally, elective labor induction is done no earlier than 39 weeks of pregnancy. What methods are used for labor induction? Methods used for labor induction include:  Prostaglandin medicine. This medicine starts contractions and causes the cervix to dilate and ripen. It can be taken by mouth (orally) or by being inserted into the vagina (suppository).  Inserting a small, thin tube (catheter) with a balloon into the vagina and then expanding the balloon with water to dilate the cervix.  Stripping the membranes. In this method, your health care provider gently separates amniotic sac tissue from the cervix. This causes the cervix to stretch, which in turn causes the release of a hormone called progesterone. The hormone causes the uterus to contract. This procedure is often done during an office visit, after which you will be sent home to wait for contractions to begin.  Breaking the water. In this method, your health care provider uses a small instrument to make a small hole in the amniotic sac. This eventually causes the amniotic sac to break. Contractions should begin after a few hours.  Medicine to trigger or strengthen contractions. This medicine is given through an IV that is inserted into a vein in your arm. Except for membrane stripping, which can be done in a clinic, labor induction is done in the hospital so that you and your baby can be carefully monitored. How long does it take for labor to  be induced? The length of time it takes to induce labor depends on how ready your body is for labor. Some inductions can take up to 2-3 days, while others may take less than a day. Induction may take longer if:  You are induced early in your pregnancy.  It is your first pregnancy.  Your cervix is not ready. What are some risks associated with labor induction? Some risks associated with labor induction include:    Changes in fetal heart rate, such as being too high, too low, or irregular (erratic).  Failed induction.  Infection in the mother or the baby.  Increased risk of having a cesarean delivery.  Fetal death.  Breaking off (abruption) of the placenta from the uterus (rare).  Rupture of the uterus (very rare). When induction is needed for medical reasons, the benefits of induction generally outweigh the risks. What are some reasons for not inducing labor? Labor induction should not be done if:  Your baby does not tolerate contractions.  You have had previous surgeries on your uterus, such as a myomectomy, removal of fibroids, or a vertical scar from a previous cesarean delivery.  Your placenta lies very low in your uterus and blocks the opening of the cervix (placenta previa).  Your baby is not in a head-down position.  The umbilical cord drops down into the birth canal in front of the baby.  There are unusual circumstances, such as the baby being very early (premature).  You have had more than 2 previous cesarean deliveries. Summary  Labor induction is when steps are taken to cause a pregnant woman to begin the labor process.  Labor induction causes a pregnant woman's uterus to contract. It also causes the cervix to ripen, dilate, and efface.  Labor is not induced before 39 weeks of pregnancy unless there is a medical reason to do so.  When induction is needed for medical reasons, the benefits of induction generally outweigh the risks. This information is not  intended to replace advice given to you by your health care provider. Make sure you discuss any questions you have with your health care provider. Document Revised: 11/10/2017 Document Reviewed: 12/21/2016 Elsevier Patient Education  2020 [employer]  

## 2020-03-11 ENCOUNTER — Ambulatory Visit (INDEPENDENT_AMBULATORY_CARE_PROVIDER_SITE_OTHER): Payer: Medicaid Other | Admitting: Obstetrics and Gynecology

## 2020-03-11 ENCOUNTER — Other Ambulatory Visit: Payer: Self-pay

## 2020-03-11 VITALS — BP 124/84 | Wt 213.0 lb

## 2020-03-11 DIAGNOSIS — D696 Thrombocytopenia, unspecified: Secondary | ICD-10-CM

## 2020-03-11 DIAGNOSIS — Z3400 Encounter for supervision of normal first pregnancy, unspecified trimester: Secondary | ICD-10-CM

## 2020-03-11 DIAGNOSIS — O99113 Other diseases of the blood and blood-forming organs and certain disorders involving the immune mechanism complicating pregnancy, third trimester: Secondary | ICD-10-CM

## 2020-03-11 DIAGNOSIS — Z3A38 38 weeks gestation of pregnancy: Secondary | ICD-10-CM

## 2020-03-11 LAB — POCT URINALYSIS DIPSTICK OB
Glucose, UA: NEGATIVE
POC,PROTEIN,UA: NEGATIVE

## 2020-03-11 NOTE — Progress Notes (Signed)
    Routine Prenatal Care Visit  Subjective  Jocelyn Baldwin is a 24 y.o. G1P0000 at [redacted]w[redacted]d being seen today for ongoing prenatal care.  She is currently monitored for the following issues for this high-risk pregnancy and has Nausea and vomiting during pregnancy; Supervision of normal first pregnancy, antepartum; and Gestational thrombocytopenia, third trimester (HCC) on their problem list.  ----------------------------------------------------------------------------------- Patient reports no complaints.   Contractions: Not present. Vag. Bleeding: None.  Movement: Present. Denies leaking of fluid.  ----------------------------------------------------------------------------------- The following portions of the patient's history were reviewed and updated as appropriate: allergies, current medications, past family history, past medical history, past social history, past surgical history and problem list. Problem list updated.   Objective  Blood pressure 124/84, weight 213 lb (96.6 kg), last menstrual period 06/15/2019. Pregravid weight 178 lb (80.7 kg) Total Weight Gain 35 lb (15.9 kg) Urinalysis:      Fetal Status: Fetal Heart Rate (bpm): 145 Fundal Height: 38 cm Movement: Present  Presentation: Vertex  General:  Alert, oriented and cooperative. Patient is in no acute distress.  Skin: Skin is warm and dry. No rash noted.   Cardiovascular: Normal heart rate noted  Respiratory: Normal respiratory effort, no problems with respiration noted  Abdomen: Soft, gravid, appropriate for gestational age. Pain/Pressure: Present     Pelvic:  Cervical exam deferred Dilation: Fingertip    Very narrow pubic arch  Extremities: Normal range of motion.     ental Status: Normal mood and affect. Normal behavior. Normal judgment and thought content.     Assessment   24 y.o. G1P0000 at [redacted]w[redacted]d by  03/21/2020, by Last Menstrual Period presenting for routine prenatal visit  Plan   Pregnancy#1  Problems (from 06/15/19 to present)    Problem Noted Resolved   Gestational thrombocytopenia, third trimester (HCC) 03/11/2020 by Vena Austria, MD No   Nausea and vomiting during pregnancy 08/05/2019 by Conard Novak, MD No   Overview Signed 08/05/2019  2:02 PM by Conard Novak, MD    - [name] Rx at 7 weeks      Supervision of normal first pregnancy, antepartum 08/05/2019 by Conard Novak, MD No   Overview Addendum 03/11/2020  5:00 PM by Vena Austria, MD    [facility] Prenatal Labs  Dating L=7 Blood type: O/Positive/-- (09/02 1034)   Genetic Screen AFP:     NIPS: Negative XX Antibody:Negative (09/02 1034)  Anatomic [place] Complete/normal/female Placenta anterior/11/06/19 Rubella: 1.58 (09/02 1034) Varicella: Immune  GTT Third trimester: 97 RPR: Non Reactive (09/02 1034)   Rhogam n/a HBsAg: Negative (09/02 1034)   Vaccines TDAP:  01/29/20                     Flu Shot: HIV: Non Reactive (09/02 1034)   Baby Food      Breast                          GBS: negative  Contraception Considering POP Pap:  CBB     CS/VBAC n/a   Support Person Husband Clayburn Pert              Gestational age appropriate obstetric precautions including but not limited to vaginal bleeding, contractions, leaking of fluid and fetal movement were reviewed in detail with the patient.    Return if symptoms worsen or fail to improve.  Vena Austria, MD, [facility], [facility] 03/11/2020, 5:02 PM

## 2020-03-13 ENCOUNTER — Other Ambulatory Visit
Admission: RE | Admit: 2020-03-13 | Discharge: 2020-03-13 | Disposition: A | Discharge status: Home / Self Care | Payer: Medicaid Other | Source: Ambulatory Visit | Attending: Obstetrics and Gynecology | Admitting: Obstetrics and Gynecology

## 2020-03-13 DIAGNOSIS — Z20822 Contact with and (suspected) exposure to covid-19: Secondary | ICD-10-CM | POA: Diagnosis not present

## 2020-03-13 DIAGNOSIS — Z01812 Encounter for preprocedural laboratory examination: Secondary | ICD-10-CM | POA: Diagnosis not present

## 2020-03-13 LAB — SARS CORONAVIRUS 2 (TAT 6-24 HRS): SARS Coronavirus 2: NEGATIVE

## 2020-03-16 ENCOUNTER — Inpatient Hospital Stay
Admission: RE | Admit: 2020-03-16 | Discharge: 2020-03-20 | DRG: 768 | Disposition: A | Discharge status: Other Acute Inpatient Hospital | Payer: Medicaid Other | Attending: Obstetrics and Gynecology | Admitting: Obstetrics and Gynecology

## 2020-03-16 ENCOUNTER — Other Ambulatory Visit: Payer: Self-pay

## 2020-03-16 ENCOUNTER — Encounter: Payer: Self-pay | Admitting: Obstetrics and Gynecology

## 2020-03-16 DIAGNOSIS — O99113 Other diseases of the blood and blood-forming organs and certain disorders involving the immune mechanism complicating pregnancy, third trimester: Secondary | ICD-10-CM | POA: Diagnosis not present

## 2020-03-16 DIAGNOSIS — R571 Hypovolemic shock: Secondary | ICD-10-CM | POA: Diagnosis not present

## 2020-03-16 DIAGNOSIS — D696 Thrombocytopenia, unspecified: Secondary | ICD-10-CM

## 2020-03-16 DIAGNOSIS — O219 Vomiting of pregnancy, unspecified: Secondary | ICD-10-CM

## 2020-03-16 DIAGNOSIS — O9912 Other diseases of the blood and blood-forming organs and certain disorders involving the immune mechanism complicating childbirth: Secondary | ICD-10-CM | POA: Diagnosis present

## 2020-03-16 DIAGNOSIS — N855 Inversion of uterus: Secondary | ICD-10-CM

## 2020-03-16 DIAGNOSIS — R338 Other retention of urine: Secondary | ICD-10-CM | POA: Diagnosis not present

## 2020-03-16 DIAGNOSIS — D6959 Other secondary thrombocytopenia: Secondary | ICD-10-CM | POA: Diagnosis present

## 2020-03-16 DIAGNOSIS — Z3A39 39 weeks gestation of pregnancy: Secondary | ICD-10-CM

## 2020-03-16 DIAGNOSIS — R578 Other shock: Secondary | ICD-10-CM | POA: Diagnosis not present

## 2020-03-16 DIAGNOSIS — Z3400 Encounter for supervision of normal first pregnancy, unspecified trimester: Secondary | ICD-10-CM

## 2020-03-16 DIAGNOSIS — Z9889 Other specified postprocedural states: Secondary | ICD-10-CM

## 2020-03-16 LAB — CBC
HCT: 40.4 % (ref 36.0–46.0)
Hemoglobin: 13.9 g/dL (ref 12.0–15.0)
MCH: 30.1 pg (ref 26.0–34.0)
MCHC: 34.4 g/dL (ref 30.0–36.0)
MCV: 87.4 fL (ref 80.0–100.0)
Platelets: 113 10*3/uL — ABNORMAL LOW (ref 150–400)
RBC: 4.62 MIL/uL (ref 3.87–5.11)
RDW: 13.4 % (ref 11.5–15.5)
WBC: 9.5 10*3/uL (ref 4.0–10.5)
nRBC: 0 % (ref 0.0–0.2)

## 2020-03-16 LAB — CBC WITH DIFFERENTIAL/PLATELET
Abs Immature Granulocytes: 0.05 10*3/uL (ref 0.00–0.07)
Basophils Absolute: 0 10*3/uL (ref 0.0–0.1)
Basophils Relative: 0 %
Eosinophils Absolute: 0 10*3/uL (ref 0.0–0.5)
Eosinophils Relative: 0 %
HCT: 39.1 % (ref 36.0–46.0)
Hemoglobin: 13.8 g/dL (ref 12.0–15.0)
Immature Granulocytes: 0 %
Lymphocytes Relative: 14 %
Lymphs Abs: 1.9 10*3/uL (ref 0.7–4.0)
MCH: 30.2 pg (ref 26.0–34.0)
MCHC: 35.3 g/dL (ref 30.0–36.0)
MCV: 85.6 fL (ref 80.0–100.0)
Monocytes Absolute: 1 10*3/uL (ref 0.1–1.0)
Monocytes Relative: 7 %
Neutro Abs: 10.8 10*3/uL — ABNORMAL HIGH (ref 1.7–7.7)
Neutrophils Relative %: 79 %
Platelets: 100 10*3/uL — ABNORMAL LOW (ref 150–400)
RBC: 4.57 MIL/uL (ref 3.87–5.11)
RDW: 13.5 % (ref 11.5–15.5)
WBC: 13.7 10*3/uL — ABNORMAL HIGH (ref 4.0–10.5)
nRBC: 0 % (ref 0.0–0.2)

## 2020-03-16 MED ORDER — MISOPROSTOL 200 MCG PO TABS
ORAL_TABLET | ORAL | Status: AC
  Filled 2020-03-16: qty 4

## 2020-03-16 MED ORDER — LACTATED RINGERS IV SOLN: INTRAVENOUS | Status: DC

## 2020-03-16 MED ORDER — OXYTOCIN BOLUS FROM INFUSION
500.0000 mL | Freq: Once | INTRAVENOUS | Status: AC
  Administered 2020-03-17: 500 mL via INTRAVENOUS

## 2020-03-16 MED ORDER — LIDOCAINE HCL (PF) 1 % IJ SOLN
30.0000 mL | INTRAMUSCULAR | Status: AC | PRN
  Administered 2020-03-17: 1.2 mL via SUBCUTANEOUS

## 2020-03-16 MED ORDER — OXYTOCIN 40 UNITS IN NORMAL SALINE INFUSION - SIMPLE MED
1.0000 m[IU]/min | INTRAVENOUS | Status: DC
  Administered 2020-03-17: 03:00:00 2 m[IU]/min via INTRAVENOUS
  Filled 2020-03-16: qty 1000

## 2020-03-16 MED ORDER — BUTORPHANOL TARTRATE 1 MG/ML IJ SOLN
1.0000 mg | INTRAMUSCULAR | Status: DC | PRN
  Administered 2020-03-16: 1 mg via INTRAVENOUS
  Filled 2020-03-16: qty 1

## 2020-03-16 MED ORDER — ONDANSETRON HCL 4 MG/2ML IJ SOLN: 4.0000 mg | Freq: Four times a day (QID) | INTRAMUSCULAR | Status: DC | PRN

## 2020-03-16 MED ORDER — TERBUTALINE SULFATE 1 MG/ML IJ SOLN: 0.2500 mg | Freq: Once | INTRAMUSCULAR | Status: DC | PRN

## 2020-03-16 MED ORDER — LACTATED RINGERS IV SOLN: 500.0000 mL | INTRAVENOUS | Status: DC | PRN

## 2020-03-16 MED ORDER — AMMONIA AROMATIC IN INHA
RESPIRATORY_TRACT | Status: AC
  Filled 2020-03-16: qty 10

## 2020-03-16 MED ORDER — ACETAMINOPHEN 325 MG PO TABS: 650.0000 mg | ORAL_TABLET | ORAL | Status: DC | PRN

## 2020-03-16 MED ORDER — LIDOCAINE HCL (PF) 1 % IJ SOLN
INTRAMUSCULAR | Status: AC
  Filled 2020-03-16: qty 30

## 2020-03-16 MED ORDER — OXYTOCIN 40 UNITS IN NORMAL SALINE INFUSION - SIMPLE MED
2.5000 [IU]/h | INTRAVENOUS | Status: DC
  Filled 2020-03-16: qty 1000

## 2020-03-16 MED ORDER — MISOPROSTOL 25 MCG QUARTER TABLET
ORAL_TABLET | ORAL | Status: AC
  Filled 2020-03-16: qty 2

## 2020-03-16 MED ORDER — MISOPROSTOL 25 MCG QUARTER TABLET
25.0000 ug | ORAL_TABLET | ORAL | Status: DC | PRN
  Administered 2020-03-16 (×3): 25 ug via VAGINAL
  Filled 2020-03-16 (×2): qty 1

## 2020-03-16 MED ORDER — MISOPROSTOL 25 MCG QUARTER TABLET
25.0000 ug | ORAL_TABLET | Freq: Once | ORAL | Status: AC
  Administered 2020-03-16: 25 ug via BUCCAL

## 2020-03-16 MED ORDER — OXYTOCIN 10 UNIT/ML IJ SOLN
INTRAMUSCULAR | Status: AC
  Filled 2020-03-16: qty 2

## 2020-03-16 NOTE — H&P (Addendum)
OB History & Physical   History of Present Illness:  Chief Complaint: IOL for gestational thrombocytopenia  HPI:  Jocelyn Baldwin is a 24 y.o. G1P0000 female at [redacted]w[redacted]d dated byLMP .  Her pregnancy has been complicated by gestational thrombocytopenia.    She denies contractions.   She denies leakage of fluid.   She denies vaginal bleeding.   She reports fetal movement.    Total weight gain for pregnancy: 15.9 kg   Obstetrical Problem List: Pregnancy#1 Problems (from 06/15/19 to present)    Problem Noted Resolved   Gestational thrombocytopenia, third trimester ([place]) 03/11/2020 by Malachy Mood, MD No   Nausea and vomiting during pregnancy 08/05/2019 by Will Bonnet, MD No   Overview Signed 08/05/2019  2:02 PM by Will Bonnet, MD    - Jocelyn Baldwin Rx at 7 weeks      Supervision of normal first pregnancy, antepartum 08/05/2019 by Will Bonnet, MD No   Overview Addendum 03/11/2020  5:00 PM by Malachy Mood, [place] Prenatal Labs  Dating L=7 Blood type: O/Positive/-- (09/02 1034)   Genetic Screen AFP:     NIPS: Negative XX Antibody:Negative (09/02 1034)  Anatomic [place] Complete/normal/female Placenta anterior/11/06/19 Rubella: 1.58 (09/02 1034) Varicella: Immune  GTT Third trimester: 97 RPR: Non Reactive (09/02 1034)   Rhogam n/a HBsAg: Negative (09/02 1034)   Vaccines TDAP:  01/29/20                     Flu Shot: HIV: Non Reactive (09/02 1034)   Baby Food      Breast                          GBS: negative GC/CT negative  Contraception Considering POP Pap: June 2020 normal- in [place]  [facility]     CS/VBAC n/a   Support Person Husband Jocelyn Baldwin               Maternal Medical History:  History reviewed. No pertinent past medical history.  Past Surgical History:  Procedure Laterality Date  . NO PAST SURGERIES      Allergies  Allergen Reactions  . Sulfa Antibiotics Anaphylaxis  . Amoxicillin Hives  . Latex Itching and Rash  .  Penicillins Hives    Prior to Admission medications   Medication Sig Start Date End Date Taking? Authorizing Provider  DICLEGIS 10-10 MG TBEC TAKE 1 TABLET BY MOUTH IN THE MORNING, ONE TABLET IN THE AFTERNOON, AND 2 TABLETS AT BEDTIME. 02/03/20  Yes Dalia Heading, CNM  fluticasone (FLONASE) 50 MCG/ACT nasal spray Place into both nostrils daily.   Yes [provider]  Prenatal Vit-Fe Fumarate-FA (MULTIVITAMIN-PRENATAL) 27-0.8 MG TABS tablet Take 1 tablet by mouth daily at 12 noon.   Yes [provider]  nitrofurantoin, macrocrystal-monohydrate, (MACROBID) 100 MG capsule Take 1 capsule (100 mg total) by mouth 2 (two) times daily. 01/02/20   Dalia Heading, CNM  promethazine (PHENERGAN) 25 MG tablet Take 1 tablet (25 mg total) by mouth every 6 (six) hours as needed for nausea or vomiting. Patient not taking: Reported on 01/02/2020 09/23/19   Rexene Agent, CNM    OB History  Jocelyn Baldwin Term Preterm AB Living  1 0 0 0 0 0  SAB TAB Ectopic Multiple Live Births  0 0 0 0 0    # Outcome Date GA Lbr Len/2nd Weight Sex Delivery Anes PTL Lv  1 Current  Prenatal care site: [facility]  Social History: She  reports that she has never smoked. She has never used smokeless tobacco. She reports current alcohol use. She reports that she does not use drugs.  Family History: family history includes Breast cancer in her paternal grandmother; Cancer in her maternal grandfather and paternal grandmother; Hypertension in her father, maternal grandfather, and paternal grandfather; Stroke in her paternal grandfather.    Review of Systems:  Review of Systems  Constitutional: Negative for chills and fever.  HENT: Negative for congestion, ear discharge, ear pain, hearing loss, sinus pain and sore throat.   Eyes: Negative for blurred vision and double vision.  Respiratory: Negative for cough, shortness of breath and wheezing.   Cardiovascular: Negative for chest pain,  palpitations and leg swelling.  Gastrointestinal: Negative for abdominal pain, blood in stool, constipation, diarrhea, heartburn, melena, nausea and vomiting.  Genitourinary: Negative for dysuria, flank pain, frequency, hematuria and urgency.  Musculoskeletal: Negative for back pain, joint pain and myalgias.  Skin: Negative for itching and rash.  Neurological: Negative for dizziness, tingling, tremors, sensory change, speech change, focal weakness, seizures, loss of consciousness, weakness and headaches.  Endo/Heme/Allergies: Negative for environmental allergies. Does not bruise/bleed easily.  Psychiatric/Behavioral: Negative for depression, hallucinations, memory loss, substance abuse and suicidal ideas. The patient is not nervous/anxious and does not have insomnia.      Physical Exam:  BP 128/77 (BP Location: Left Arm)   Pulse (!) 111   Temp 98.6 F (37 C) (Oral)   Resp 16   Ht 5\' 6"  (1.676 m)   Wt 96.6 kg   LMP 06/15/2019 (Exact Date)   BMI 34.38 kg/m   Constitutional: Well nourished, well developed female in no acute distress.  HEENT: normal Skin: Warm and dry.  Cardiovascular: Regular rate and rhythm.   Extremity: no edema  Respiratory: Clear to auscultation bilateral. Normal respiratory effort Abdomen: FHT present Back: no CVAT Neuro: DTRs 2+, Cranial nerves grossly intact Psych: Alert and Oriented x3. No memory deficits. Normal mood and affect.  MS: normal gait, normal bilateral lower extremity ROM/strength/stability.  Pelvic exam: (female chaperone present) is limited by body habitus: narrow pubic arch EGBUS: within normal limits Vagina: within normal limits and with normal mucosa blood Cervix: unable to reach os- edge of cervix feels thick  Results for Jocelyn, Baldwin (MRN Melissa Noon) as of 03/16/2020 09:51  Ref. Range 03/16/2020 08:43 03/16/2020 08:54  WBC Latest Ref Range: 4.0 - 10.5 K/uL 9.5   RBC Latest Ref Range: 3.87 - 5.11 MIL/uL 4.62   Hemoglobin  Latest Ref Range: 12.0 - 15.0 g/dL 03/18/2020   HCT Latest Ref Range: 36.0 - 46.0 % 40.4   MCV Latest Ref Range: 80.0 - 100.0 fL 87.4   MCH Latest Ref Range: 26.0 - 34.0 pg 30.1   MCHC Latest Ref Range: 30.0 - 36.0 g/dL 09.6   RDW Latest Ref Range: 11.5 - 15.5 % 13.4   Platelets Latest Ref Range: 150 - 400 K/uL 113 (L)   nRBC Latest Ref Range: 0.0 - 0.2 % 0.0   Sample Expiration Unknown  03/19/2020,2359...  Antibody Screen Unknown  PENDING  ABO/RH(D) Unknown  PENDING     Baseline FHR: 155 beats/min   Variability: moderate   Accelerations: present   Decelerations: absent Contractions: present frequency: every 5-8 min Overall assessment: reassuring   Lab Results  Component Value Date   SARSCOV2NAA NEGATIVE 03/13/2020  ]  Assessment:  Jocelyn Baldwin is a 24 y.o. G57P0000 female at  [redacted]w[redacted]d with planned IOL for gestational thrombocytopenia.   Plan:  1. Admit to Labor & Delivery  2. CBC, T&S, Clrs, IVF, repeat platelet count as needed for epidural 3. GBS negative.   4. Fetal well-being: Category I 5. Begin induction with cytotec: 25 mcg cervical, 25 mcg buccal  Tresea Mall, CNM 03/16/2020 9:46 AM

## 2020-03-16 NOTE — Progress Notes (Signed)
Pt presented to birthplace for scheduled IOL for thrombocytopenia, states no pain, LOM, bleeding, or discharge. VSS. Verbalized understanding of plan.

## 2020-03-17 ENCOUNTER — Other Ambulatory Visit: Payer: Self-pay | Admitting: Obstetrics and Gynecology

## 2020-03-17 ENCOUNTER — Inpatient Hospital Stay: Payer: Medicaid Other | Admitting: Anesthesiology

## 2020-03-17 ENCOUNTER — Encounter
Admission: RE | Disposition: A | Discharge status: Other Acute Inpatient Hospital | Payer: Self-pay | Source: Home / Self Care | Attending: Obstetrics and Gynecology

## 2020-03-17 ENCOUNTER — Encounter: Payer: Self-pay | Admitting: Advanced Practice Midwife

## 2020-03-17 DIAGNOSIS — Z3A39 39 weeks gestation of pregnancy: Secondary | ICD-10-CM

## 2020-03-17 DIAGNOSIS — Z9889 Other specified postprocedural states: Secondary | ICD-10-CM

## 2020-03-17 HISTORY — PX: REPAIR VAGINAL CUFF: SHX6067

## 2020-03-17 LAB — CBC
HCT: 40.6 % (ref 36.0–46.0)
HCT: 25.3 % — ABNORMAL LOW (ref 36.0–46.0)
Hemoglobin: 14.3 g/dL (ref 12.0–15.0)
Hemoglobin: 8.7 g/dL — ABNORMAL LOW (ref 12.0–15.0)
MCH: 30 pg (ref 26.0–34.0)
MCH: 31 pg (ref 26.0–34.0)
MCHC: 35.2 g/dL (ref 30.0–36.0)
MCHC: 34.4 g/dL (ref 30.0–36.0)
MCV: 85.1 fL (ref 80.0–100.0)
MCV: 90 fL (ref 80.0–100.0)
Platelets: 116 10*3/uL — ABNORMAL LOW (ref 150–400)
Platelets: 157 10*3/uL (ref 150–400)
RBC: 4.77 MIL/uL (ref 3.87–5.11)
RBC: 2.81 MIL/uL — ABNORMAL LOW (ref 3.87–5.11)
RDW: 13.4 % (ref 11.5–15.5)
RDW: 13.5 % (ref 11.5–15.5)
WBC: 14.1 10*3/uL — ABNORMAL HIGH (ref 4.0–10.5)
WBC: 27.5 10*3/uL — ABNORMAL HIGH (ref 4.0–10.5)
nRBC: 0 % (ref 0.0–0.2)
nRBC: 0 % (ref 0.0–0.2)

## 2020-03-17 LAB — PREPARE RBC (CROSSMATCH)

## 2020-03-17 LAB — GLUCOSE, CAPILLARY: Glucose-Capillary: 143 mg/dL — ABNORMAL HIGH (ref 70–99)

## 2020-03-17 LAB — PROTIME-INR
INR: 1.3 — ABNORMAL HIGH (ref 0.8–1.2)
INR: 1.2 (ref 0.8–1.2)
Prothrombin Time: 15.2 seconds (ref 11.4–15.2)
Prothrombin Time: 14.8 seconds (ref 11.4–15.2)

## 2020-03-17 LAB — IMMATURE PLATELET FRACTION: Immature Platelet Fraction: 16.3 % — ABNORMAL HIGH (ref 1.2–8.6)

## 2020-03-17 LAB — FIBRINOGEN: Fibrinogen: 309 mg/dL (ref 210–475)

## 2020-03-17 LAB — ABO/RH: ABO/RH(D): O POS

## 2020-03-17 LAB — FIBRIN DERIVATIVES D-DIMER (ARMC ONLY): Fibrin derivatives D-dimer (ARMC): 6517.34 ng/mL (FEU) — ABNORMAL HIGH (ref 0.00–499.00)

## 2020-03-17 LAB — PROCALCITONIN: Procalcitonin: 0.29 ng/mL

## 2020-03-17 LAB — RPR: RPR Ser Ql: NONREACTIVE

## 2020-03-17 LAB — APTT: aPTT: 25 seconds (ref 24–36)

## 2020-03-17 SURGERY — REPAIR, VAGINAL CUFF
Anesthesia: Epidural

## 2020-03-17 MED ORDER — CHLORHEXIDINE GLUCONATE CLOTH 2 % EX PADS
6.0000 | MEDICATED_PAD | Freq: Every day | CUTANEOUS | Status: DC
  Administered 2020-03-17 – 2020-03-19 (×2): 6 via TOPICAL

## 2020-03-17 MED ORDER — FENTANYL CITRATE (PF) 100 MCG/2ML IJ SOLN
100.0000 ug | Freq: Once | INTRAMUSCULAR | Status: AC
  Administered 2020-03-17: 17:00:00 100 ug via INTRAVENOUS

## 2020-03-17 MED ORDER — ETOMIDATE 2 MG/ML IV SOLN
INTRAVENOUS | Status: AC
  Filled 2020-03-17: qty 10

## 2020-03-17 MED ORDER — LACTATED RINGERS IV SOLN: INTRAVENOUS | Status: DC | PRN

## 2020-03-17 MED ORDER — FENTANYL 2.5 MCG/ML W/ROPIVACAINE 0.15% IN NS 100 ML EPIDURAL (ARMC)
12.0000 mL/h | EPIDURAL | Status: DC
  Administered 2020-03-17: 12 mL/h via EPIDURAL
  Filled 2020-03-17: qty 100

## 2020-03-17 MED ORDER — SIMETHICONE 80 MG PO CHEW
80.0000 mg | CHEWABLE_TABLET | ORAL | Status: DC | PRN
  Filled 2020-03-17: qty 1

## 2020-03-17 MED ORDER — ONDANSETRON HCL 4 MG/2ML IJ SOLN: 4.0000 mg | Freq: Four times a day (QID) | INTRAMUSCULAR | Status: DC | PRN

## 2020-03-17 MED ORDER — HYDROMORPHONE 1 MG/ML IV SOLN
INTRAVENOUS | Status: DC
  Administered 2020-03-18: 25 mg via INTRAVENOUS
  Administered 2020-03-18: 5 mg via INTRAVENOUS
  Filled 2020-03-17: qty 30

## 2020-03-17 MED ORDER — FENTANYL CITRATE (PF) 100 MCG/2ML IJ SOLN
INTRAMUSCULAR | Status: AC
  Filled 2020-03-17: qty 2

## 2020-03-17 MED ORDER — SODIUM CHLORIDE 0.9 % IV SOLN
250.0000 mL | INTRAVENOUS | Status: DC
  Administered 2020-03-17: 250 mL via INTRAVENOUS

## 2020-03-17 MED ORDER — ONDANSETRON HCL 4 MG/2ML IJ SOLN
INTRAMUSCULAR | Status: AC
  Filled 2020-03-17: qty 2

## 2020-03-17 MED ORDER — CALCIUM CHLORIDE 10 % IV SOLN
INTRAVENOUS | Status: DC | PRN
Start: 2020-03-17 — End: 2020-03-17
  Administered 2020-03-17 (×2): 1 g via INTRAVENOUS

## 2020-03-17 MED ORDER — LIDOCAINE-EPINEPHRINE (PF) 1.5 %-1:200000 IJ SOLN
INTRAMUSCULAR | Status: DC | PRN
  Administered 2020-03-17: 3 mL via EPIDURAL

## 2020-03-17 MED ORDER — EPHEDRINE 5 MG/ML INJ: 10.0000 mg | INTRAVENOUS | Status: DC | PRN

## 2020-03-17 MED ORDER — SODIUM CHLORIDE 0.9% FLUSH: 9.0000 mL | INTRAVENOUS | Status: DC | PRN

## 2020-03-17 MED ORDER — DIPHENHYDRAMINE HCL 25 MG PO CAPS
25.0000 mg | ORAL_CAPSULE | Freq: Four times a day (QID) | ORAL | Status: DC | PRN
  Filled 2020-03-17: qty 1

## 2020-03-17 MED ORDER — EPINEPHRINE PF 1 MG/ML IJ SOLN
INTRAMUSCULAR | Status: DC | PRN
Start: 2020-03-17 — End: 2020-03-17
  Administered 2020-03-17 (×2): .01 mg via INTRAVENOUS

## 2020-03-17 MED ORDER — OXYCODONE HCL 5 MG PO TABS: 5.0000 mg | ORAL_TABLET | Freq: Once | ORAL | Status: DC | PRN

## 2020-03-17 MED ORDER — MIDAZOLAM HCL 2 MG/2ML IJ SOLN
INTRAMUSCULAR | Status: DC | PRN
  Administered 2020-03-17 (×2): 1 mg via INTRAVENOUS
  Administered 2020-03-17: 2 mg via INTRAVENOUS

## 2020-03-17 MED ORDER — CALCIUM CHLORIDE 10 % IV SOLN
INTRAVENOUS | Status: AC
  Filled 2020-03-17: qty 10

## 2020-03-17 MED ORDER — BENZOCAINE-MENTHOL 20-0.5 % EX AERO
1.0000 "application " | INHALATION_SPRAY | CUTANEOUS | Status: DC | PRN
  Filled 2020-03-17: qty 56

## 2020-03-17 MED ORDER — FENTANYL CITRATE (PF) 100 MCG/2ML IJ SOLN
25.0000 ug | INTRAMUSCULAR | Status: DC | PRN
  Administered 2020-03-17 – 2020-03-18 (×4): 25 ug via INTRAVENOUS
  Filled 2020-03-17 (×4): qty 2

## 2020-03-17 MED ORDER — OXYCODONE-ACETAMINOPHEN 5-325 MG PO TABS
2.0000 | ORAL_TABLET | ORAL | Status: DC | PRN
  Administered 2020-03-17: 2 via ORAL
  Filled 2020-03-17: qty 2

## 2020-03-17 MED ORDER — LACTATED RINGERS IV SOLN: INTRAVENOUS | Status: DC

## 2020-03-17 MED ORDER — LIDOCAINE HCL (PF) 2 % IJ SOLN
INTRAMUSCULAR | Status: AC
  Filled 2020-03-17: qty 15

## 2020-03-17 MED ORDER — VASOPRESSIN 20 UNIT/ML IV SOLN
INTRAVENOUS | Status: DC | PRN
  Administered 2020-03-17 (×2): 2 [IU] via INTRAVENOUS
  Administered 2020-03-17: 3 [IU] via INTRAVENOUS
  Administered 2020-03-17 (×2): 2 [IU] via INTRAVENOUS

## 2020-03-17 MED ORDER — ONDANSETRON HCL 4 MG/2ML IJ SOLN: 4.0000 mg | INTRAMUSCULAR | Status: DC | PRN

## 2020-03-17 MED ORDER — PHENYLEPHRINE 40 MCG/ML (10ML) SYRINGE FOR IV PUSH (FOR BLOOD PRESSURE SUPPORT): 80.0000 ug | PREFILLED_SYRINGE | INTRAVENOUS | Status: DC | PRN

## 2020-03-17 MED ORDER — FENTANYL CITRATE (PF) 100 MCG/2ML IJ SOLN
100.0000 ug | Freq: Once | INTRAMUSCULAR | Status: AC
  Administered 2020-03-17: 21:00:00 100 ug via INTRAVENOUS
  Filled 2020-03-17: qty 2

## 2020-03-17 MED ORDER — FENTANYL 2.5 MCG/ML W/ROPIVACAINE 0.15% IN NS 100 ML EPIDURAL (ARMC)
EPIDURAL | Status: DC | PRN
  Administered 2020-03-17: 12 mL/h via EPIDURAL

## 2020-03-17 MED ORDER — FENTANYL 2.5 MCG/ML W/ROPIVACAINE 0.15% IN NS 100 ML EPIDURAL (ARMC)
EPIDURAL | Status: AC
  Filled 2020-03-17: qty 100

## 2020-03-17 MED ORDER — LIDOCAINE HCL 2 % IJ SOLN
INTRAMUSCULAR | Status: AC
  Filled 2020-03-17: qty 10

## 2020-03-17 MED ORDER — PRENATAL MULTIVITAMIN CH
1.0000 | ORAL_TABLET | Freq: Every day | ORAL | Status: DC
  Administered 2020-03-18 – 2020-03-19 (×2): 1 via ORAL
  Filled 2020-03-17 (×3): qty 1

## 2020-03-17 MED ORDER — FENTANYL CITRATE (PF) 100 MCG/2ML IJ SOLN: 25.0000 ug | INTRAMUSCULAR | Status: DC | PRN

## 2020-03-17 MED ORDER — ONDANSETRON HCL 4 MG PO TABS: 4.0000 mg | ORAL_TABLET | ORAL | Status: DC | PRN

## 2020-03-17 MED ORDER — DIPHENHYDRAMINE HCL 12.5 MG/5ML PO ELIX
12.5000 mg | ORAL_SOLUTION | Freq: Four times a day (QID) | ORAL | Status: DC | PRN
  Filled 2020-03-17: qty 5

## 2020-03-17 MED ORDER — WITCH HAZEL-GLYCERIN EX PADS
1.0000 "application " | MEDICATED_PAD | CUTANEOUS | Status: DC | PRN
  Filled 2020-03-17: qty 100

## 2020-03-17 MED ORDER — IBUPROFEN 600 MG PO TABS
600.0000 mg | ORAL_TABLET | Freq: Four times a day (QID) | ORAL | Status: DC
  Administered 2020-03-17 – 2020-03-18 (×3): 600 mg via ORAL
  Filled 2020-03-17 (×2): qty 2

## 2020-03-17 MED ORDER — SODIUM CHLORIDE 0.9 % IV SOLN
INTRAVENOUS | Status: DC | PRN
  Administered 2020-03-17: 50 ug/min via INTRAVENOUS

## 2020-03-17 MED ORDER — SENNOSIDES-DOCUSATE SODIUM 8.6-50 MG PO TABS
2.0000 | ORAL_TABLET | ORAL | Status: DC
  Administered 2020-03-18 – 2020-03-19 (×2): 2 via ORAL
  Filled 2020-03-17 (×2): qty 2

## 2020-03-17 MED ORDER — SODIUM CHLORIDE 0.9 % IV SOLN: INTRAVENOUS | Status: DC | PRN

## 2020-03-17 MED ORDER — NALOXONE HCL 0.4 MG/ML IJ SOLN: 0.4000 mg | INTRAMUSCULAR | Status: DC | PRN

## 2020-03-17 MED ORDER — DIPHENHYDRAMINE HCL 50 MG/ML IJ SOLN: 12.5000 mg | INTRAMUSCULAR | Status: DC | PRN

## 2020-03-17 MED ORDER — SODIUM CHLORIDE 0.9% IV SOLUTION: Freq: Once | INTRAVENOUS | Status: DC

## 2020-03-17 MED ORDER — DOCUSATE SODIUM 100 MG PO CAPS: 100.0000 mg | ORAL_CAPSULE | Freq: Two times a day (BID) | ORAL | Status: DC | PRN

## 2020-03-17 MED ORDER — OXYTOCIN 40 UNITS IN NORMAL SALINE INFUSION - SIMPLE MED
2.5000 [IU]/h | INTRAVENOUS | Status: AC
  Filled 2020-03-17 (×3): qty 1000

## 2020-03-17 MED ORDER — PHENYLEPHRINE HCL (PRESSORS) 10 MG/ML IV SOLN
INTRAVENOUS | Status: DC | PRN
  Administered 2020-03-17 (×2): 100 ug via INTRAVENOUS
  Administered 2020-03-17: 250 ug via INTRAVENOUS
  Administered 2020-03-17: 200 ug via INTRAVENOUS
  Administered 2020-03-17: 250 ug via INTRAVENOUS
  Administered 2020-03-17: 100 ug via INTRAVENOUS

## 2020-03-17 MED ORDER — MIDAZOLAM HCL 2 MG/2ML IJ SOLN
INTRAMUSCULAR | Status: AC
  Filled 2020-03-17: qty 2

## 2020-03-17 MED ORDER — SODIUM CHLORIDE 0.9 % IV SOLN
25.0000 ug/min | INTRAVENOUS | Status: DC
  Filled 2020-03-17: qty 1

## 2020-03-17 MED ORDER — PROPOFOL 10 MG/ML IV BOLUS
INTRAVENOUS | Status: AC
  Filled 2020-03-17: qty 20

## 2020-03-17 MED ORDER — LIDOCAINE-EPINEPHRINE (PF) 2 %-1:200000 IJ SOLN
INTRAMUSCULAR | Status: DC | PRN
  Administered 2020-03-17: 4 mL via EPIDURAL
  Administered 2020-03-17: 10 mL via EPIDURAL
  Administered 2020-03-17: 3 mL via EPIDURAL
  Administered 2020-03-17: 5 mL via EPIDURAL

## 2020-03-17 MED ORDER — LIDOCAINE HCL (PF) 2 % IJ SOLN
INTRAMUSCULAR | Status: AC
  Filled 2020-03-17: qty 10

## 2020-03-17 MED ORDER — KETAMINE HCL 50 MG/ML IJ SOLN
INTRAMUSCULAR | Status: AC
  Filled 2020-03-17: qty 10

## 2020-03-17 MED ORDER — COCONUT OIL OIL
1.0000 "application " | TOPICAL_OIL | Status: DC | PRN
  Filled 2020-03-17: qty 120

## 2020-03-17 MED ORDER — OXYCODONE-ACETAMINOPHEN 5-325 MG PO TABS: 1.0000 | ORAL_TABLET | ORAL | Status: DC | PRN

## 2020-03-17 MED ORDER — OXYCODONE HCL 5 MG/5ML PO SOLN: 5.0000 mg | Freq: Once | ORAL | Status: DC | PRN

## 2020-03-17 MED ORDER — ACETAMINOPHEN 10 MG/ML IV SOLN: 1000.0000 mg | Freq: Once | INTRAVENOUS | Status: DC | PRN

## 2020-03-17 MED ORDER — LACTATED RINGERS IV SOLN: 500.0000 mL | Freq: Once | INTRAVENOUS | Status: DC

## 2020-03-17 MED ORDER — EPINEPHRINE PF 1 MG/ML IJ SOLN
INTRAMUSCULAR | Status: AC
  Filled 2020-03-17: qty 1

## 2020-03-17 MED ORDER — ACETAMINOPHEN 325 MG PO TABS: 650.0000 mg | ORAL_TABLET | ORAL | Status: DC | PRN

## 2020-03-17 MED ORDER — LIDOCAINE HCL (PF) 2 % IJ SOLN
INTRAMUSCULAR | Status: AC
  Filled 2020-03-17: qty 5

## 2020-03-17 MED ORDER — DIBUCAINE (PERIANAL) 1 % EX OINT
1.0000 "application " | TOPICAL_OINTMENT | CUTANEOUS | Status: DC | PRN
  Filled 2020-03-17: qty 28

## 2020-03-17 MED ORDER — DIPHENHYDRAMINE HCL 50 MG/ML IJ SOLN: 12.5000 mg | Freq: Four times a day (QID) | INTRAMUSCULAR | Status: DC | PRN

## 2020-03-17 MED ORDER — KETAMINE HCL 10 MG/ML IJ SOLN
INTRAMUSCULAR | Status: DC | PRN
Start: 2020-03-17 — End: 2020-03-17
  Administered 2020-03-17: 20 mg via INTRAVENOUS
  Administered 2020-03-17: 10 mg via INTRAVENOUS

## 2020-03-17 MED ORDER — POLYETHYLENE GLYCOL 3350 17 G PO PACK
17.0000 g | PACK | Freq: Every day | ORAL | Status: DC | PRN
  Filled 2020-03-17: qty 1

## 2020-03-17 MED ORDER — ONDANSETRON HCL 4 MG/2ML IJ SOLN: 4.0000 mg | Freq: Once | INTRAMUSCULAR | Status: DC | PRN

## 2020-03-17 SURGICAL SUPPLY — 20 items
BAG URINE DRAIN 2000ML AR STRL (UROLOGICAL SUPPLIES) ×3 IMPLANT
CATH FOLEY 2WAY  5CC 16FR (CATHETERS) ×2
CATH URTH 16FR FL 2W BLN LF (CATHETERS) ×1 IMPLANT
COUNTER NEEDLE 20/40 LG (NEEDLE) ×3 IMPLANT
COVER LIGHT HANDLE STERIS (MISCELLANEOUS) ×3 IMPLANT
COVER WAND RF STERILE (DRAPES) ×3 IMPLANT
GAUZE PACK 2X3YD (GAUZE/BANDAGES/DRESSINGS) ×3 IMPLANT
GLOVE BIO SURGEON STRL SZ7 (GLOVE) ×3 IMPLANT
GLOVE INDICATOR 7.5 STRL GRN (GLOVE) ×3 IMPLANT
GOWN STRL REUS W/ TWL LRG LVL3 (GOWN DISPOSABLE) ×2 IMPLANT
GOWN STRL REUS W/TWL LRG LVL3 (GOWN DISPOSABLE) ×4
KIT TURNOVER CYSTO (KITS) ×3 IMPLANT
LIGHT WAVEGUIDE WIDE FLAT (MISCELLANEOUS) ×3 IMPLANT
PACK DNC HYST (MISCELLANEOUS) ×3 IMPLANT
PAD OB MATERNITY 4.3X12.25 (PERSONAL CARE ITEMS) ×3 IMPLANT
PAD PREP 24X41 OB/GYN DISP (PERSONAL CARE ITEMS) ×3 IMPLANT
SUT VIC AB 0 CT1 36 (SUTURE) ×15 IMPLANT
SUT VIC AB 2-0 CT1 27 (SUTURE) ×4
SUT VIC AB 2-0 CT1 TAPERPNT 27 (SUTURE) ×2 IMPLANT
TOWEL OR 17X26 4PK STRL BLUE (TOWEL DISPOSABLE) ×3 IMPLANT

## 2020-03-17 NOTE — Anesthesia Procedure Notes (Signed)
Arterial Line Insertion Start/End4/27/2021 6:44 PM, 03/17/2020 6:48 PM Performed by: Corinda Gubler, MD, Lynden Oxford, CRNA, anesthesiologist  Patient location: OR. Preanesthetic checklist: patient identified, IV checked, site marked, risks and benefits discussed, surgical consent, monitors and equipment checked, pre-op evaluation, timeout performed and anesthesia consent Lidocaine 1% used for infiltration radial was placed Catheter size: 20 Fr Hand hygiene performed  and maximum sterile barriers used   Attempts: 1 Procedure performed using ultrasound guided technique. Ultrasound Notes:anatomy identified Following insertion, dressing applied. Post procedure assessment: normal and unchanged  Patient tolerated the procedure well with no immediate complications.

## 2020-03-17 NOTE — Anesthesia Preprocedure Evaluation (Signed)
Anesthesia Evaluation  Patient identified by MRN, date of birth, ID band Patient awake    Reviewed: Allergy & Precautions, NPO status , Patient's Chart, lab work & pertinent test results  History of Anesthesia Complications Negative for: history of anesthetic complications  Airway Mallampati: II  TM Distance: >3 FB Neck ROM: Full    Dental no notable dental hx. (+) Teeth Intact, Dental Advisory Given   Pulmonary neg pulmonary ROS, neg sleep apnea, neg COPD, Patient abstained from smoking.Not current smoker,    Pulmonary exam normal breath sounds clear to auscultation       Cardiovascular Exercise Tolerance: Good METS(-) hypertension(-) CAD and (-) Past MI negative cardio ROS  (-) dysrhythmias  Rhythm:Regular Rate:Tachycardia - Systolic murmurs    Neuro/Psych negative neurological ROS  negative psych ROS   GI/Hepatic neg GERD  ,(+)     (-) substance abuse  ,   Endo/Other  neg diabetes  Renal/GU negative Renal ROS     Musculoskeletal   Abdominal   Peds  Hematology   Anesthesia Other Findings History reviewed. No pertinent past medical history.  Reproductive/Obstetrics                             Anesthesia Physical Anesthesia Plan  ASA: II and emergent  Anesthesia Plan: Epidural and General   Post-op Pain Management:    Induction: Intravenous  PONV Risk Score and Plan: 3 and Ondansetron, Dexamethasone and Midazolam  Airway Management Planned: Oral ETT and Natural Airway  Additional Equipment: None  Intra-op Plan:   Post-operative Plan: Extubation in OR  Informed Consent: I have reviewed the patients History and Physical, chart, labs and discussed the procedure including the risks, benefits and alternatives for the proposed anesthesia with the patient or authorized representative who has indicated his/her understanding and acceptance.     Dental advisory given  Plan  Discussed with: CRNA and Surgeon  Anesthesia Plan Comments: (Post vaginal delivery vaginal laceration with copious bleeding for OR repair. In situ epidural plan to use, if does not adequately work, advised patient we will proceed with general anesthesia/ RSI. Discussed risks of anesthesia with patient, including PONV, sore throat, lip/dental damage, surgical bleeding needing blood products. Rare risks discussed as well, such as cardiorespiratory and neurological sequelae. Patient understands.)        Anesthesia Quick Evaluation

## 2020-03-17 NOTE — Consult Note (Signed)
Name: Jocelyn Baldwin MRN: [id number] DOB: Jul 26, 1996    ADMISSION DATE:  03/16/2020 CONSULTATION DATE: 03/17/2020  REFERRING MD : Dr. Georgianne Fick   CHIEF COMPLAINT: Hypotension   BRIEF PATIENT DESCRIPTION:  24 yo female G1P1001 with hemorrhagic shock secondary to vaginal laceration resulting in postpartum hemorrhage complicated by gestational thrombocytopenia requiring blood products and neo-synephrine gtt   HPI/SIGNIFICANT EVENTS 04/27: Pt delivered an 8lb 4 oz viable female via vaginal delivery, however following delivery pt developed postpartum hemorrhage due to a vaginal laceration complicated by gestational thrombocytopenia with ESBL of 1,650 following delivery.  She was subsequently transferred to the OR to undergo repair of the vaginal laceration, post repair pt continued bleeding vaginal packing with 2 lap pads with no evidence of continued bleeding according to Op Note.  Intraoperatively pt had an EBL of 2,000 ml. She received 2.5L of crystalloid, 4 units of pRBC's, 1 unit of FFP, along with initiation of neo-synephrine gtt due to hypotension.  She required transfer to ICU postop and PCCM team consulted for assistance with treatment plan   PAST MEDICAL HISTORY :   has no past medical history on file.  has a past surgical history that includes No past surgeries. Prior to Admission medications   Medication Sig Start Date End Date Taking? Authorizing Provider  DICLEGIS 10-10 MG TBEC TAKE 1 TABLET BY MOUTH IN THE MORNING, ONE TABLET IN THE AFTERNOON, AND 2 TABLETS AT BEDTIME. 02/03/20  Yes Dalia Heading, CNM  fluticasone (FLONASE) 50 MCG/ACT nasal spray Place into both nostrils daily.   Yes [provider]  Prenatal Vit-Fe Fumarate-FA (MULTIVITAMIN-PRENATAL) 27-0.8 MG TABS tablet Take 1 tablet by mouth daily at 12 noon.   Yes [provider]  nitrofurantoin, macrocrystal-monohydrate, (MACROBID) 100 MG capsule Take 1 capsule (100 mg total) by mouth 2  (two) times daily. 01/02/20   Dalia Heading, CNM  promethazine (PHENERGAN) 25 MG tablet Take 1 tablet (25 mg total) by mouth every 6 (six) hours as needed for nausea or vomiting. Patient not taking: Reported on 01/02/2020 09/23/19   Rexene Agent, CNM   Allergies  Allergen Reactions  . Sulfa Antibiotics Anaphylaxis  . Amoxicillin Hives  . Latex Itching and Rash  . Penicillins Hives    FAMILY HISTORY:  family history includes Breast cancer in her paternal grandmother; Cancer in her maternal grandfather and paternal grandmother; Hypertension in her father, maternal grandfather, and paternal grandfather; Stroke in her paternal grandfather. SOCIAL HISTORY:  reports that she has never smoked. She has never used smokeless tobacco. She reports current alcohol use. She reports that she does not use drugs.  REVIEW OF SYSTEMS: Positives in BOLD  Constitutional: Negative for fever, chills, weight loss, malaise/fatigue and diaphoresis.  HENT: Negative for hearing loss, ear pain, nosebleeds, congestion, sore throat, neck pain, tinnitus and ear discharge.   Eyes: Negative for blurred vision, double vision, photophobia, pain, discharge and redness.  Respiratory: Negative for cough, hemoptysis, sputum production, shortness of breath, wheezing and stridor.   Cardiovascular: Negative for chest pain, palpitations, orthopnea, claudication, leg swelling and PND.  Gastrointestinal: heartburn, nausea, vomiting, abdominal pain/cramping, diarrhea, constipation, blood in stool and melena.  Genitourinary: Negative for dysuria, urgency, frequency, hematuria and flank pain.  Musculoskeletal: Negative for myalgias, back pain, joint pain and falls.  Skin: Negative for itching and rash.  Neurological: Negative for dizziness, tingling, tremors, sensory change, speech change, focal weakness, seizures, loss of consciousness, weakness and headaches.  Endo/Heme/Allergies: Negative for environmental allergies and  polydipsia. Does not bruise/bleed  easily.  SUBJECTIVE:  States pain has improved significantly following administration of fentanyl  VITAL SIGNS: Temp:  [97.7 F (36.5 C)-99.6 F (37.6 C)] 99.6 F (37.6 C) (04/27 2020) Pulse Rate:  [64-152] 120 (04/27 2030) Resp:  [14-18] 14 (04/27 2030) BP: (84-125)/(33-88) 122/85 (04/27 2020) SpO2:  [89 %-100 %] 100 % (04/27 2030) Arterial Line BP: (89)/(65) 89/65 (04/27 2030)  PHYSICAL EXAMINATION: General: acutely ill appearing female, NAD resting in bed  Neuro: alert and oriented, follows commands  HEENT: supple, no JVD  Cardiovascular: sinus tach, no R/G Lungs: clear throughout, even, non labored  Abdomen: +BS x4, soft, obese, abdominal cramping present  GU: no signs of active vaginal bleeding  Musculoskeletal: normal bulk and tone, no edema  Skin: intact no rashes or lesions present   No results for input(s): NA, K, CL, CO2, BUN, CREATININE, GLUCOSE in the last 168 hours. Recent Labs  Lab 03/16/20 2130 03/16/20 2330 03/17/20 1939  HGB 13.8 14.3 8.7*  HCT 39.1 40.6 25.3*  WBC 13.7* 14.1* 27.5*  PLT 100* 116* 157   No results found.  ASSESSMENT / PLAN:  S/p Vaginal delivery of viable female complicated by hemorrhagic shock secondary to vaginal laceration resulting in postpartum hemorrhage complicated by gestational thrombocytopenia s/p vaginal laceration repair with vaginal packing 03/17/2020 Leukocytosis  Postop pain  Trend CBC, coags, and fibrinogen level  Transfuse for signs of active bleeding and/or hgb <8; will keep 2 units of pRBC's ahead in blood bank at all times for now  Aggressive fluid resuscitation and prn peripheral neo-synephrine to maintain map >65 Trend WBC and monitor fever curve Will check PCT, if elevated recommend starting empiric abx  Trend BMP  Replace electrolytes as indicated Monitor UOP  Aggressive pulmonary hygiene Prn supplemental O2 for dyspnea and/or hypoxic  Continue morphine gtt for pain  management and OBGYN to contact anesthesiologist regarding epidural that is currently in place  Bowel regimen initiated  OBGYN primary on case-postpartum management per recommendations  Sonda Rumble, AGNP  Pulmonary/Critical Care Pager [phone] (please enter 7 digits) PCCM Consult Pager [phone] (please enter 7 digits)

## 2020-03-17 NOTE — Anesthesia Procedure Notes (Signed)
Epidural Patient location during procedure: OB Start time: 03/17/2020 12:37 AM End time: 03/17/2020 1:07 AM  Staffing Performed: anesthesiologist   Preanesthetic Checklist Completed: patient identified, IV checked, site marked, risks and benefits discussed, surgical consent, monitors and equipment checked, pre-op evaluation and timeout performed  Epidural Patient position: sitting Prep: Betadine Patient monitoring: heart rate, continuous pulse ox and blood pressure Approach: midline Location: L4-L5 Injection technique: LOR saline  Needle:  Needle type: Tuohy  Needle gauge: 17 G Needle length: 9 cm and 9 Needle insertion depth: 8 cm Catheter type: closed end flexible Catheter size: 20 Guage Catheter at skin depth: 13 cm Test dose: negative and 1.5% lidocaine with Epi 1:200 K  Assessment Events: blood not aspirated, injection not painful, no injection resistance, no paresthesia and negative IV test  Additional Notes   Patient tolerated the insertion well without complications.Reason for block:procedure for pain

## 2020-03-17 NOTE — Progress Notes (Signed)
Obstetric and Gynecology  Subjective  Met with patient to discuss plan of care and surgical events  Objective  Vital signs in last 24 hours: Temp:  [97.7 F (36.5 C)-99.6 F (37.6 C)] 99.6 F (37.6 C) (04/27 2020) Pulse Rate:  [64-152] 99 (04/27 2300) Resp:  [7-18] 7 (04/27 2300) BP: (84-125)/(33-88) 104/71 (04/27 2300) SpO2:  [89 %-100 %] 97 % (04/27 2300) Arterial Line BP: (74-93)/(65-86) 74/68 (04/27 2300)     Intake/Output Summary (Last 24 hours) at 03/17/2020 2320 Last data filed at 03/17/2020 2027 Gross per 24 hour  Intake 5164 ml  Output 7450 ml  Net -2286 ml    General: NAD Abdomen: soft, fundus firm Pelvic: packing remains in place, no bleeding noted on peripad Extremities: no edema  Labs: Results for orders placed or performed during the hospital encounter of 03/16/20 (from the past 24 hour(s))  CBC     Status: Abnormal   Collection Time: 03/16/20 11:30 PM  Result Value Ref Range   WBC 14.1 (H) 4.0 - 10.5 K/uL   RBC 4.77 3.87 - 5.11 MIL/uL   Hemoglobin 14.3 12.0 - 15.0 g/dL   HCT 40.6 36.0 - 46.0 %   MCV 85.1 80.0 - 100.0 fL   MCH 30.0 26.0 - 34.0 pg   MCHC 35.2 30.0 - 36.0 g/dL   RDW 13.4 11.5 - 15.5 %   Platelets 116 (L) 150 - 400 K/uL   nRBC 0.0 0.0 - 0.2 %  ABO/Rh     Status: None   Collection Time: 03/16/20 11:30 PM  Result Value Ref Range   ABO/RH(D)      O POS Performed at [facility], [address] [place], [place] [address]   Prepare RBC (crossmatch)     Status: None   Collection Time: 03/17/20  5:08 PM  Result Value Ref Range   Order Confirmation      ORDER PROCESSED BY BLOOD BANK Performed at [facility], [address] [place], [place] [address]   Prepare fresh frozen plasma     Status: None (Preliminary result)   Collection Time: 03/17/20  7:16 PM  Result Value Ref Range   Unit Number [id number]    Blood Component Type THW PLS APHR    Unit division A0    Status of Unit ISSUED    Transfusion Status       OK TO TRANSFUSE Performed at [facility], [address] [place], [place] [address]   APTT     Status: None   Collection Time: 03/17/20  7:39 PM  Result Value Ref Range   aPTT 25 24 - 36 seconds  Protime-INR     Status: Abnormal   Collection Time: 03/17/20  7:39 PM  Result Value Ref Range   Prothrombin Time 15.2 11.4 - 15.2 seconds   INR 1.3 (H) 0.8 - 1.2  CBC     Status: Abnormal   Collection Time: 03/17/20  7:39 PM  Result Value Ref Range   WBC 27.5 (H) 4.0 - 10.5 K/uL   RBC 2.81 (L) 3.87 - 5.11 MIL/uL   Hemoglobin 8.7 (L) 12.0 - 15.0 g/dL   HCT 25.3 (L) 36.0 - 46.0 %   MCV 90.0 80.0 - 100.0 fL   MCH 31.0 26.0 - 34.0 pg   MCHC 34.4 30.0 - 36.0 g/dL   RDW 13.5 11.5 - 15.5 %   Platelets 157 150 - 400 K/uL   nRBC 0.0 0.0 - 0.2 %  Glucose, capillary  Status: Abnormal   Collection Time: 03/17/20  8:27 PM  Result Value Ref Range   Glucose-Capillary 143 (H) 70 - 99 mg/dL  Fibrinogen     Status: None   Collection Time: 03/17/20 10:26 PM  Result Value Ref Range   Fibrinogen 309 210 - 475 mg/dL  Protime-INR     Status: None   Collection Time: 03/17/20 10:26 PM  Result Value Ref Range   Prothrombin Time 14.8 11.4 - 15.2 seconds   INR 1.2 0.8 - 1.2  Procalcitonin - Baseline     Status: None   Collection Time: 03/17/20 10:26 PM  Result Value Ref Range   Procalcitonin 0.29 ng/mL    Cultures: Results for orders placed or performed during the hospital encounter of 03/13/20  SARS CORONAVIRUS 2 (TAT 6-24 HRS) Nasopharyngeal Nasopharyngeal Swab     Status: None   Collection Time: 03/13/20 12:28 PM   Specimen: Nasopharyngeal Swab  Result Value Ref Range Status   SARS Coronavirus 2 NEGATIVE NEGATIVE Final    Comment: (NOTE) SARS-CoV-2 target nucleic acids are NOT DETECTED. The SARS-CoV-2 RNA is generally detectable in upper and lower respiratory specimens during the acute phase of infection. Negative results do not preclude SARS-CoV-2 infection, do not rule  out co-infections with other pathogens, and should not be used as the sole basis for treatment or other patient management decisions. Negative results must be combined with clinical observations, patient history, and epidemiological information. The expected result is Negative. Fact Sheet for Patients: SugarRoll.be Fact Sheet for Healthcare Providers: https://www.woods-mathews.com/ This test is not yet approved or cleared by the [place] FDA and  has been authorized for detection and/or diagnosis of SARS-CoV-2 by FDA under an Emergency Use Authorization (EUA). This EUA will remain  in effect (meaning this test can be used) for the duration of the COVID-19 declaration under Section 56 4(b)(1) of the Act, 21 U.S.C. section 360bbb-3(b)(1), unless the authorization is terminated or revoked sooner. Performed at [facility], [place] [address], [place], [place] [address]     Imaging:  Assessment   24 y.o. G1P1001 PPD#0 VAVD with vaginal laceration and postpartum hemorrhage  Plan    1) Postpartum hemorrhage - received 4U pRBC and 1U FFP intraoperatively.  Vaginal packing remains in place (2 lap sponges).  [facility] were contacted and currently have no bed availability.  Speaking with attending the recommded continuted plan of care and did not have any addition suggestion at this time - plan to remove packing tomorrow AM.  Bedside vs OR - trend CBC and fibrinogen q4-hrs - Currently no bleeding with packing in place but is having some cramping.   2) FEN - NPO except water and ice chips should repeat OR evaluation be required.    3) DVT ppx - SCD's  4) Pain control - dilaudid PCA

## 2020-03-17 NOTE — Op Note (Addendum)
Preoperative Diagnosis: 1) 24 y.o. G1P1001 with vaginal laceration and postpartum hemorrhage 2) Gestational thrombocytopenia  Postoperative Diagnosis: 1) 24 y.o.  G1P1001 with vaginal laceration and postpartum hemorrhage  2) Gestational thrombocytopenia  Operation Performed: Exam under anesthesia, repair of vaginal laceration, vaginal packing  Indication: 24 y.o. G1P1001 status post vacuum assisted vaginal delivery at 16:23 on 03/17/2020 1650 estimated blood loss    Anesthesia: Epidural  Primary Surgeon: Vena Austria, MD  Assistant: Annamarie Major, MD this surgery required a high level surgical assistant with none other readily available  Preoperative Antibiotics: none  Estimated Blood Loss: 2000 mL in addition to the following delivery  IV Fluids: 2.5L of crystaloid and 4 units of pRBC and 1 unit of FFP  Drains or Tubes: Foley to gravity drainage  Implants: none  Specimens Removed: None  Complications: none  Intraoperative Findings:  Perineum was intact.  There was a stellate laceration in the posterior fourchette at the level of the hymenal ring extending laterally to the left and right, along with a deeper laceration running posteriorly.  The laceration was repaired in running layer of 0 Vicryl.  There was an arterial bleeder noted on the left aspect of the incision which was tied off.  Despite good closure of the laceration the patient continued to have visible oozing without source able to be visualized.  The fundus was firm.  Cervix was unable to be clearly visualized as it was displaced anteriorly below a very narrow pubic arch.  Patient Condition: stable  Procedure in Detail:  Patient was taken to the operating room were she had her epidural redosed.  She was positioned in the dorsal lithotomy position utilizing Allen stirups, prepped and draped in the usual sterile fashion.  Prior to proceeding with the case a time out was performed.    Attention was turned to  the patient's pelvis.  An indwelling foley catheter was used to empty the patient's bladder.  An Gelpi retractor was placed to allow of the posterior vaginal wall and the laceration.  The laceration was repaired using a running 2-0 Vicryl for the lateral aspects of the laceration.  The posterior portion of the repair was closed using a second 2-0 Vicryl.  An additional stitch was placed at the junction of the left and posterior laceration to tie of an arterial bleeder.  Bimanual exam revealed firm uterus, no evidence of atony.  The cervical os was unable to be visualized although no cervical lacerations were palpated. Dr. Tiburcio Pea was called in to assist with retraction and obtain a second set of eyes and was also in agreement that there was no additional defects amenable to suturing.     CBC and Fibrinogen were drawn by anesthesia and were pending at conclusion of the case.  Given continued bleeding the patient was packed with 2 lap pads.  With observation the packing appeared to be holding the bleeding.     Sponge needle and instrument counts were corrects times two.  The patient tolerated the procedure well and was taken to the recovery room in stable condition.

## 2020-03-17 NOTE — Transfer of Care (Signed)
Immediate Anesthesia Transfer of Care Note  Patient: Jocelyn Baldwin  Procedure(s) Performed: REPAIR VAGINAL CUFF (N/A )  Patient Location: ICU  Anesthesia Type:MAC  Level of Consciousness: awake, drowsy and patient cooperative  Airway & Oxygen Therapy: Patient Spontanous Breathing and Patient connected to face mask oxygen  Post-op Assessment: Report given to RN and Post -op Vital signs reviewed and stable, on Phenylephrine infusion, BP normotensive post second set of 2 units  Post vital signs: Reviewed and stable  Last Vitals:  Vitals Value Taken Time  BP 122/85 03/17/20 2019  Temp    Pulse 97   Resp 13 03/17/20 2028  SpO2 100%   Vitals shown include unvalidated device data.  Last Pain:  Vitals:   03/17/20 1400  TempSrc: Oral  PainSc:          Complications: No apparent anesthesia complications

## 2020-03-17 NOTE — Progress Notes (Signed)
Patient with deep posterior vaginal laceration, intact perineum.  Brisk arterial bleeding noted.  Unable to adequately visualized at the bedside. Discussed need for operative repair given brisk bleeding.  Rectal exam confirms intact rectum.  Epidural has work off so patient with significant discomfort with attempted repair.  OR called an informed may be able re-dose epidural    Vena Austria, MD, [facility], [facility] 03/17/2020, 5:02 PM

## 2020-03-17 NOTE — Anesthesia Preprocedure Evaluation (Signed)
Anesthesia Evaluation  Patient identified by MRN, date of birth, ID band Patient awake    Reviewed: Allergy & Precautions, NPO status , Patient's Chart, lab work & pertinent test results  History of Anesthesia Complications Negative for: history of anesthetic complications  Airway Mallampati: II       Dental   Pulmonary neg sleep apnea, neg COPD, Not current smoker,           Cardiovascular (-) hypertension(-) Past MI and (-) CHF (-) dysrhythmias (-) Valvular Problems/Murmurs     Neuro/Psych neg Seizures    GI/Hepatic Neg liver ROS, GERD (with pregnancy)  ,  Endo/Other  neg diabetes  Renal/GU negative Renal ROS     Musculoskeletal   Abdominal   Peds  Hematology  (+) Blood dyscrasia (pregnancy induced thromobcytopenia, plts 116 currently), ,   Anesthesia Other Findings   Reproductive/Obstetrics                             Anesthesia Physical Anesthesia Plan  ASA: II  Anesthesia Plan: Epidural   Post-op Pain Management:    Induction:   PONV Risk Score and Plan:   Airway Management Planned:   Additional Equipment:   Intra-op Plan:   Post-operative Plan:   Informed Consent: I have reviewed the patients History and Physical, chart, labs and discussed the procedure including the risks, benefits and alternatives for the proposed anesthesia with the patient or authorized representative who has indicated his/her understanding and acceptance.       Plan Discussed with:   Anesthesia Plan Comments:         Anesthesia Quick Evaluation

## 2020-03-17 NOTE — Discharge Summary (Signed)
Obstetric Discharge Summary Reason for Admission: induction of labor, gestational thrombocytopenia Prenatal Procedures: none Intrapartum Procedures: spontaneous vaginal delivery Postpartum Procedures: Operative repair vaginal laceration 03/17/2020, Attempted reduction of uterine inversion and laparotomy 03/20/2020 Complications-Operative and Postpartum: uterine inversion Hemoglobin  Date Value Ref Range Status  03/20/2020 9.2 (L) 12.0 - 15.0 g/dL Final    Comment:    POST TRANSFUSION SPECIMEN  02/27/2020 13.2 11.1 - 15.9 g/dL Final   HCT  Date Value Ref Range Status  03/20/2020 26.3 (L) 36.0 - 46.0 % Final   Hematocrit  Date Value Ref Range Status  02/27/2020 39.2 34.0 - 46.6 % Final   Hospital Course in Detail 24 y.o. G1 admitted on 03/16/2020 for elective induction of labor at 39 weeks 0 days gestation for a pregnancy complicated by gestational thrombocytopenia with a platelet count nadir of 100K.  The patient had an uneventful intrapartum course until delivery on 03/17/2020 at around 16:30 which was vacuum assisted.  Immediately before delivery a gush of blood was noted by the delivering provider, following delivery of the placenta a postpartum hemorrhage was encountered and attributed to a posterior vaginal laceration.  Given inadequate anesthesia and exposure patient was taken to the operating room for repair where she had her epidural redosed.  QBL at the time of delivery was .  The repair was successful, no rectal involvement and intact perineum, however bleeding remained greater than expected with EBL in OR of an additional .  The cervix was unable to be visualized with ballooning of the what assumed to be the lower uterine segment noted with concern for possible hematoma in the space of [place].  The vagina was packed patient stabilized and the patient was transferred to ICU initially on pressor support phenylephrine 1107mcg/min.  H&H remained stable overnight and the patient  was transferred to the floor with CT abdomen/pelvis 03/18/2020 showing no evidence of hematoma or contrast extravasation.  Not was made of a bulbus uterus larger than expected with possible retained products of conception in the differential.  The patient's bleeding remained stable.  Bleeding remained appropriate with urinary retention noted on removal of the foley catheter 03/19/2020 with of urine.  Further imaging was undertaken including ultrasound and MRI abdomen and pelvis with MRI confirming the diagnosis of uterine inversion.  The patient was taken to the operating room on the morning of 03/20/2020 for attempted correction of the uterine inversion which was unsuccessfully with decision made to transfer to a tertiary care facility prior to attempting further operative attempts at correcting inversion or possible hysterectomy.  Intraoperative blood loss was an additional .  The patient received 6 additional units of blood and 1 unit of FFP.  Postoperative blood has remained minimal with QBL based on pad weight of approximately .  Physical Exam:  Temp:  [97.5 F (36.4 C)-98.9 F (37.2 C)] 98.4 F (36.9 C) (04/30 1609) Pulse Rate:  [79-130] 98 (04/30 1609) Resp:  [8-25] 11 (04/30 1609) BP: (102-139)/(59-81) 116/69 (04/30 1300) SpO2:  [97 %-100 %] 99 % (04/30 1609) Arterial Line BP: (119-159)/(44-73) 154/65 (04/30 1220) Weight:  [95.3 kg] 95.3 kg (04/30 1300)   General: alert, appears stated age and no distress Lochia: appropriate Uterine Fundus: firm below level of umbilicus Incision: D/C/I DVT Evaluation: No evidence of DVT seen on physical exam.  Results for orders placed or performed during the hospital encounter of 03/16/20 (from the past 72 hour(s))  Prepare RBC (crossmatch)     Status: None   Collection Time: 03/17/20  5:08 PM  Result Value Ref Range   Order Confirmation      ORDER PROCESSED BY BLOOD BANK Performed at [facility], [address]  [place], [place] [address]   Prepare fresh frozen plasma     Status: None   Collection Time: 03/17/20  7:16 PM  Result Value Ref Range   Unit Number [id number]    Blood Component Type THW PLS APHR    Unit division A0    Status of Unit ISSUED,FINAL    Transfusion Status      OK TO TRANSFUSE Performed at [facility], [address] [place], [place] [address]   APTT     Status: None   Collection Time: 03/17/20  7:39 PM  Result Value Ref Range   aPTT 25 24 - 36 seconds    Comment: Performed at [facility], [address] [place], [place] [address]  Protime-INR     Status: Abnormal   Collection Time: 03/17/20  7:39 PM  Result Value Ref Range   Prothrombin Time 15.2 11.4 - 15.2 seconds   INR 1.3 (H) 0.8 - 1.2    Comment: (NOTE) INR goal varies based on device and disease states. Performed at [facility], [address] [place], [place] [address]   Fibrin derivatives D-Dimer [facility] only)     Status: Abnormal   Collection Time: 03/17/20  7:39 PM  Result Value Ref Range   Fibrin derivatives D-dimer ([facility]) 6,517.34 (H) 0.00 - 499.00 ng/mL (FEU)    Comment: (NOTE) <> Exclusion of Venous Thromboembolism (VTE) - OUTPATIENT ONLY   (Emergency Department or [place])   0-499 ng/ml (FEU): With a low to intermediate pretest probability                      for VTE this test result excludes the diagnosis                      of VTE.   >499 ng/ml (FEU) : VTE not excluded; additional work up for VTE is                      required. <> Testing on Inpatients and Evaluation of Disseminated Intravascular   Coagulation (DIC) Reference Range:   0-499 ng/ml (FEU) Performed at [facility], [address] [place], [place] [address]   CBC     Status: Abnormal   Collection Time: 03/17/20  7:39 PM  Result Value Ref Range   WBC 27.5 (H) 4.0 - 10.5 K/uL    Comment: REPEATED TO VERIFY   RBC 2.81 (L) 3.87 - 5.11 MIL/uL   Hemoglobin 8.7 (L) 12.0 - 15.0 g/dL     Comment: REPEATED TO VERIFY   HCT 25.3 (L) 36.0 - 46.0 %   MCV 90.0 80.0 - 100.0 fL   MCH 31.0 26.0 - 34.0 pg   MCHC 34.4 30.0 - 36.0 g/dL   RDW 95.2 84.1 - 32.4 %   Platelets 157 150 - 400 K/uL   nRBC 0.0 0.0 - 0.2 %    Comment: Performed at [facility], [address] [place], [place] [address]  Glucose, capillary     Status: Abnormal   Collection Time: 03/17/20  8:27 PM  Result Value Ref Range   Glucose-Capillary 143 (H) 70 - 99 mg/dL    Comment: Glucose reference range applies only to samples taken after fasting for at least 8 hours.  HIV Antibody (routine testing w rflx)     Status: None   Collection Time: 03/17/20  8:49 PM  Result Value Ref Range   HIV Screen 4th Generation wRfx Non Reactive Non Reactive    Comment: Performed at [facility], [address] [place], [place] [address]  Fibrinogen     Status: None   Collection Time: 03/17/20 10:26 PM  Result Value Ref Range   Fibrinogen 309 210 - 475 mg/dL    Comment: Performed at [facility], [address] [place], [place] [address]  Protime-INR     Status: None   Collection Time: 03/17/20 10:26 PM  Result Value Ref Range   Prothrombin Time 14.8 11.4 - 15.2 seconds   INR 1.2 0.8 - 1.2    Comment: (NOTE) INR goal varies based on device and disease states. Performed at [facility], [address] [place], [place] [address]   Procalcitonin - Baseline     Status: None   Collection Time: 03/17/20 10:26 PM  Result Value Ref Range   Procalcitonin 0.29 ng/mL    Comment:        Interpretation: PCT (Procalcitonin) <= 0.5 ng/mL: Systemic infection (sepsis) is not likely. Local bacterial infection is possible. (NOTE)       Sepsis PCT Algorithm           Lower Respiratory Tract                                      Infection PCT Algorithm    ----------------------------     ----------------------------         PCT < 0.25 ng/mL                PCT < 0.10 ng/mL         Strongly encourage              Strongly discourage   discontinuation of antibiotics    initiation of antibiotics    ----------------------------     -----------------------------       PCT 0.25 - 0.50 ng/mL            PCT 0.10 - 0.25 ng/mL               OR       >80% decrease in PCT            Discourage initiation of                                            antibiotics      Encourage discontinuation           of antibiotics    ----------------------------     -----------------------------         PCT >= 0.50 ng/mL              PCT 0.26 - 0.50 ng/mL               AND        <80% decrease in PCT             Encourage initiation of  antibiotics       Encourage continuation           of antibiotics    ----------------------------     -----------------------------        PCT >= 0.50 ng/mL                  PCT > 0.50 ng/mL               AND         increase in PCT                  Strongly encourage                                      initiation of antibiotics    Strongly encourage escalation           of antibiotics                                     -----------------------------                                           PCT <= 0.25 ng/mL                                                 OR                                        > 80% decrease in PCT                                     Discontinue / Do not initiate                                             antibiotics Performed at [facility], [address] [place], [place] [address]   CBC     Status: Abnormal   Collection Time: 03/17/20 10:26 PM  Result Value Ref Range   WBC 21.8 (H) 4.0 - 10.5 K/uL   RBC 3.57 (L) 3.87 - 5.11 MIL/uL   Hemoglobin 10.6 (L) 12.0 - 15.0 g/dL   HCT 60.4 (L) 54.0 - 98.1 %   MCV 84.6 80.0 - 100.0 fL   MCH 29.7 26.0 - 34.0 pg   MCHC 35.1 30.0 - 36.0 g/dL   RDW 19.1 47.8 - 29.5 %   Platelets 84 (L) 150 - 400 K/uL    Comment: Immature Platelet Fraction may  be clinically indicated, consider ordering this additional test [id number] PLATELET COUNT CONFIRMED BY SMEAR    nRBC 0.0 0.0 - 0.2 %    Comment: Performed at [facility], [address] [place], [place] [address]  CBC     Status: Abnormal   Collection Time: 03/18/20  2:27  AM  Result Value Ref Range   WBC 20.9 (H) 4.0 - 10.5 K/uL   RBC 3.15 (L) 3.87 - 5.11 MIL/uL   Hemoglobin 9.5 (L) 12.0 - 15.0 g/dL   HCT 82.9 (L) 56.2 - 13.0 %   MCV 85.1 80.0 - 100.0 fL   MCH 30.2 26.0 - 34.0 pg   MCHC 35.4 30.0 - 36.0 g/dL   RDW 86.5 78.4 - 69.6 %   Platelets 81 (L) 150 - 400 K/uL    Comment: Immature Platelet Fraction may be clinically indicated, consider ordering this additional test [id number]    nRBC 0.0 0.0 - 0.2 %    Comment: Performed at [facility], [address] [place], [place] [address]  Fibrinogen     Status: None   Collection Time: 03/18/20  2:27 AM  Result Value Ref Range   Fibrinogen 328 210 - 475 mg/dL    Comment: Performed at [facility], [address] [place], [place] [address]  Comprehensive metabolic panel     Status: Abnormal   Collection Time: 03/18/20  2:27 AM  Result Value Ref Range   Sodium 132 (L) 135 - 145 mmol/L   Potassium 4.0 3.5 - 5.1 mmol/L   Chloride 108 98 - 111 mmol/L   CO2 18 (L) 22 - 32 mmol/L   Glucose, Bld 136 (H) 70 - 99 mg/dL    Comment: Glucose reference range applies only to samples taken after fasting for at least 8 hours.   BUN 15 6 - 20 mg/dL   Creatinine, Ser 3.66 0.44 - 1.00 mg/dL   Calcium 8.0 (L) 8.9 - 10.3 mg/dL   Total Protein 3.7 (L) 6.5 - 8.1 g/dL   Albumin 1.9 (L) 3.5 - 5.0 g/dL   AST 32 15 - 41 U/L   ALT 18 0 - 44 U/L   Alkaline Phosphatase 76 38 - 126 U/L   Total Bilirubin 0.8 0.3 - 1.2 mg/dL   GFR calc non Af Amer >60 >60 mL/min   GFR calc Af Amer >60 >60 mL/min   Anion gap 6 5 - 15    Comment: Performed at [facility], [address] [place], [place] [address]  MRSA PCR  Screening     Status: None   Collection Time: 03/18/20  5:50 AM   Specimen: Nasopharyngeal  Result Value Ref Range   MRSA by PCR NEGATIVE NEGATIVE    Comment:        The GeneXpert MRSA Assay (FDA approved for NASAL specimens only), is one component of a comprehensive MRSA colonization surveillance program. It is not intended to diagnose MRSA infection nor to guide or monitor treatment for MRSA infections. Performed at [facility], [address] [place], [place] [address]   CBC     Status: Abnormal   Collection Time: 03/18/20  6:41 AM  Result Value Ref Range   WBC 17.2 (H) 4.0 - 10.5 K/uL   RBC 2.95 (L) 3.87 - 5.11 MIL/uL   Hemoglobin 8.7 (L) 12.0 - 15.0 g/dL   HCT 56.3 (L) 87.5 - 64.3 %   MCV 85.4 80.0 - 100.0 fL   MCH 29.5 26.0 - 34.0 pg   MCHC 34.5 30.0 - 36.0 g/dL   RDW 32.9 51.8 - 84.1 %   Platelets 75 (L) 150 - 400 K/uL    Comment: Immature Platelet Fraction may be clinically indicated, consider ordering this additional test [id number]    nRBC 0.0 0.0 - 0.2 %  Comment: Performed at [facility], [address] [place], [place] [address]  Fibrinogen     Status: None   Collection Time: 03/18/20  6:41 AM  Result Value Ref Range   Fibrinogen 366 210 - 475 mg/dL    Comment: Performed at [facility], [address] [place], [place] [address]  Procalcitonin     Status: None   Collection Time: 03/18/20  6:41 AM  Result Value Ref Range   Procalcitonin 0.57 ng/mL    Comment:        Interpretation: PCT > 0.5 ng/mL and <= 2 ng/mL: Systemic infection (sepsis) is possible, but other conditions are known to elevate PCT as well. (NOTE)       Sepsis PCT Algorithm           Lower Respiratory Tract                                      Infection PCT Algorithm    ----------------------------     ----------------------------         PCT < 0.25 ng/mL                PCT < 0.10 ng/mL         Strongly encourage             Strongly discourage    discontinuation of antibiotics    initiation of antibiotics    ----------------------------     -----------------------------       PCT 0.25 - 0.50 ng/mL            PCT 0.10 - 0.25 ng/mL               OR       >80% decrease in PCT            Discourage initiation of                                            antibiotics      Encourage discontinuation           of antibiotics    ----------------------------     -----------------------------         PCT >= 0.50 ng/mL              PCT 0.26 - 0.50 ng/mL                AND       <80% decrease in PCT             Encourage initiation of                                             antibiotics       Encourage continuation           of antibiotics    ----------------------------     -----------------------------        PCT >= 0.50 ng/mL                  PCT > 0.50 ng/mL               AND  increase in PCT                  Strongly encourage                                      initiation of antibiotics    Strongly encourage escalation           of antibiotics                                     -----------------------------                                           PCT <= 0.25 ng/mL                                                 OR                                        > 80% decrease in PCT                                     Discontinue / Do not initiate                                             antibiotics Performed at [facility], [address] [place], [place] [address]   CBC     Status: Abnormal   Collection Time: 03/18/20 12:35 PM  Result Value Ref Range   WBC 16.7 (H) 4.0 - 10.5 K/uL   RBC 2.54 (L) 3.87 - 5.11 MIL/uL   Hemoglobin 7.6 (L) 12.0 - 15.0 g/dL   HCT 60.4 (L) 54.0 - 98.1 %   MCV 84.3 80.0 - 100.0 fL   MCH 29.9 26.0 - 34.0 pg   MCHC 35.5 30.0 - 36.0 g/dL   RDW 19.1 47.8 - 29.5 %   Platelets 78 (L) 150 - 400 K/uL    Comment: Immature Platelet Fraction may be clinically indicated,  consider ordering this additional test [id number]    nRBC 0.0 0.0 - 0.2 %    Comment: Performed at [facility], [address] [place], [place] [address]  Protime-INR     Status: None   Collection Time: 03/18/20 12:35 PM  Result Value Ref Range   Prothrombin Time 14.2 11.4 - 15.2 seconds   INR 1.1 0.8 - 1.2    Comment: (NOTE) INR goal varies based on device and disease states. Performed at [facility], [address] [place], [place] [address]   Fibrinogen     Status: None   Collection Time: 03/18/20 12:35 PM  Result Value Ref Range   Fibrinogen 346 210 - 475 mg/dL    Comment: Performed at [facility], [address] [place],  [place] [address]  CBC     Status: Abnormal   Collection Time: 03/18/20  6:45 PM  Result Value Ref Range   WBC 16.5 (H) 4.0 - 10.5 K/uL   RBC 2.37 (L) 3.87 - 5.11 MIL/uL   Hemoglobin 7.2 (L) 12.0 - 15.0 g/dL   HCT 65.7 (L) 84.6 - 96.2 %   MCV 85.7 80.0 - 100.0 fL   MCH 30.4 26.0 - 34.0 pg   MCHC 35.5 30.0 - 36.0 g/dL   RDW 95.2 84.1 - 32.4 %   Platelets 80 (L) 150 - 400 K/uL    Comment: Immature Platelet Fraction may be clinically indicated, consider ordering this additional test [id number]    nRBC 0.0 0.0 - 0.2 %    Comment: Performed at [facility], [address] [place], [place] [address]  Procalcitonin     Status: None   Collection Time: 03/19/20  5:50 AM  Result Value Ref Range   Procalcitonin 0.33 ng/mL    Comment:        Interpretation: PCT (Procalcitonin) <= 0.5 ng/mL: Systemic infection (sepsis) is not likely. Local bacterial infection is possible. (NOTE)       Sepsis PCT Algorithm           Lower Respiratory Tract                                      Infection PCT Algorithm    ----------------------------     ----------------------------         PCT < 0.25 ng/mL                PCT < 0.10 ng/mL         Strongly encourage             Strongly discourage   discontinuation of antibiotics     initiation of antibiotics    ----------------------------     -----------------------------       PCT 0.25 - 0.50 ng/mL            PCT 0.10 - 0.25 ng/mL               OR       >80% decrease in PCT            Discourage initiation of                                            antibiotics      Encourage discontinuation           of antibiotics    ----------------------------     -----------------------------         PCT >= 0.50 ng/mL              PCT 0.26 - 0.50 ng/mL               AND        <80% decrease in PCT             Encourage initiation of                                             antibiotics  Encourage continuation           of antibiotics    ----------------------------     -----------------------------        PCT >= 0.50 ng/mL                  PCT > 0.50 ng/mL               AND         increase in PCT                  Strongly encourage                                      initiation of antibiotics    Strongly encourage escalation           of antibiotics                                     -----------------------------                                           PCT <= 0.25 ng/mL                                                 OR                                        > 80% decrease in PCT                                     Discontinue / Do not initiate                                             antibiotics Performed at [facility], [address] [place], [place] [address]   CBC     Status: Abnormal   Collection Time: 03/19/20  5:50 AM  Result Value Ref Range   WBC 16.9 (H) 4.0 - 10.5 K/uL   RBC 2.43 (L) 3.87 - 5.11 MIL/uL   Hemoglobin 7.2 (L) 12.0 - 15.0 g/dL   HCT 60.4 (L) 54.0 - 98.1 %   MCV 86.4 80.0 - 100.0 fL   MCH 29.6 26.0 - 34.0 pg   MCHC 34.3 30.0 - 36.0 g/dL   RDW 19.1 47.8 - 29.5 %   Platelets 89 (L) 150 - 400 K/uL    Comment: CONSISTENT WITH PREVIOUS RESULT Immature Platelet Fraction may be clinically indicated,  consider ordering this additional test [id number]    nRBC 0.0 0.0 - 0.2 %    Comment: Performed at [facility], [address], [place], [place] [address]  CBC     Status: Abnormal   Collection Time: 03/19/20  7:11 PM  Result Value Ref Range  WBC 15.0 (H) 4.0 - 10.5 K/uL   RBC 2.09 (L) 3.87 - 5.11 MIL/uL   Hemoglobin 6.3 (L) 12.0 - 15.0 g/dL   HCT 16.1 (L) 09.6 - 04.5 %   MCV 86.1 80.0 - 100.0 fL   MCH 30.1 26.0 - 34.0 pg   MCHC 35.0 30.0 - 36.0 g/dL   RDW 40.9 81.1 - 91.4 %   Platelets 94 (L) 150 - 400 K/uL    Comment: Immature Platelet Fraction may be clinically indicated, consider ordering this additional test [id number]    nRBC 0.0 0.0 - 0.2 %    Comment: Performed at [facility], [address] [place], [place] [address]  Prepare RBC (crossmatch)     Status: None   Collection Time: 03/19/20  8:36 PM  Result Value Ref Range   Order Confirmation      DUPLICATE REQUEST BB SAMPLE OR UNITS ALREADY AVAILABLE Performed at [facility], [address] [place], [place] [address]   Type and screen [facility]     Status: None (Preliminary result)   Collection Time: 03/20/20  3:15 AM  Result Value Ref Range   ABO/RH(D) O POS    Antibody Screen NEG    Sample Expiration 03/23/2020,2359    Unit Number [id number]    Blood Component Type RED CELLS,LR    Unit division 00    Status of Unit REL FROM [facility]    Transfusion Status OK TO TRANSFUSE    Crossmatch Result Compatible    Unit Number [id number]    Blood Component Type RBC LR PHER2    Unit division 00    Status of Unit ISSUED    Transfusion Status OK TO TRANSFUSE    Crossmatch Result Compatible    Unit Number [id number]    Blood Component Type RED CELLS,LR    Unit division 00    Status of Unit ISSUED    Transfusion Status OK TO TRANSFUSE    Crossmatch Result Compatible    Unit Number [id number]    Blood Component Type RED CELLS,LR    Unit division 00     Status of Unit ISSUED    Transfusion Status OK TO TRANSFUSE    Crossmatch Result Compatible    Unit Number [id number]    Blood Component Type RED CELLS,LR    Unit division 00    Status of Unit ISSUED    Transfusion Status OK TO TRANSFUSE    Crossmatch Result Compatible    Unit Number [id number]    Blood Component Type RED CELLS,LR    Unit division 00    Status of Unit ISSUED    Transfusion Status OK TO TRANSFUSE    Crossmatch Result Compatible    Unit Number [id number]    Blood Component Type RED CELLS,LR    Unit division 00    Status of Unit ISSUED    Transfusion Status OK TO TRANSFUSE    Crossmatch Result Compatible    Unit Number [id number]    Blood Component Type RED CELLS,LR    Unit division 00    Status of Unit ISSUED    Transfusion Status OK TO TRANSFUSE    Crossmatch Result Compatible    Unit Number [id number]    Blood Component Type RED CELLS,LR    Unit division 00    Status of Unit ALLOCATED    Transfusion Status OK TO TRANSFUSE    Crossmatch Result Compatible    Unit Number [id number]    Blood Component  Type RED CELLS,LR    Unit division 00    Status of Unit ALLOCATED    Transfusion Status OK TO TRANSFUSE    Crossmatch Result Compatible    Unit Number [id number]    Blood Component Type RED CELLS,LR    Unit division 00    Status of Unit ALLOCATED    Transfusion Status OK TO TRANSFUSE    Crossmatch Result Compatible    Unit Number [id number]    Blood Component Type RED CELLS,LR    Unit division 00    Status of Unit ISSUED    Transfusion Status OK TO TRANSFUSE    Crossmatch Result      Compatible Performed at [facility], [address] [place], [place] [address]   Prepare RBC (crossmatch)     Status: None   Collection Time: 03/20/20  3:34 AM  Result Value Ref Range   Order Confirmation      ORDER PROCESSED BY BLOOD BANK Performed at [facility], [address] [place], [place] [address]   CBC      Status: Abnormal   Collection Time: 03/20/20  3:44 AM  Result Value Ref Range   WBC 13.3 (H) 4.0 - 10.5 K/uL   RBC 2.39 (L) 3.87 - 5.11 MIL/uL   Hemoglobin 7.2 (L) 12.0 - 15.0 g/dL   HCT 58.0 (L) 99.8 - 33.8 %   MCV 88.3 80.0 - 100.0 fL   MCH 30.1 26.0 - 34.0 pg   MCHC 34.1 30.0 - 36.0 g/dL   RDW 25.0 53.9 - 76.7 %   Platelets 98 (L) 150 - 400 K/uL    Comment: CONSISTENT WITH PREVIOUS RESULT Immature Platelet Fraction may be clinically indicated, consider ordering this additional test [id number]    nRBC 0.0 0.0 - 0.2 %    Comment: Performed at [facility], [address] [place], [place] [address]  Protime-INR     Status: None   Collection Time: 03/20/20  6:47 AM  Result Value Ref Range   Prothrombin Time 12.1 11.4 - 15.2 seconds   INR 0.9 0.8 - 1.2    Comment: (NOTE) INR goal varies based on device and disease states. Performed at [facility], [address] [place], [place] [address]   APTT     Status: None   Collection Time: 03/20/20  6:47 AM  Result Value Ref Range   aPTT 25 24 - 36 seconds    Comment: Performed at [facility], [address] [place], [place] [address]  Fibrinogen     Status: Abnormal   Collection Time: 03/20/20  6:47 AM  Result Value Ref Range   Fibrinogen 508 (H) 210 - 475 mg/dL    Comment: Performed at [facility], [address] [place], [place] [address]  Prepare RBC (crossmatch)     Status: None   Collection Time: 03/20/20  6:55 AM  Result Value Ref Range   Order Confirmation      ORDER PROCESSED BY BLOOD BANK Performed at [facility], [address], [place], [place] [address]   Initiate MTP (Blood Bank Notification)     Status: None   Collection Time: 03/20/20  9:08 AM  Result Value Ref Range   Initiate Massive Transfusion Protocol      MTP ORDER RECEIVED STARTED AT 0914 SJL Performed at [facility], [address], [place], [place] [address]   Prepare platelet  pheresis     Status: None (Preliminary result)  Collection Time: 03/20/20  9:15 AM  Result Value Ref Range   Unit Number [id number]    Blood Component Type PLTP LR1 PAS    Unit division 00    Status of Unit ISSUED    Transfusion Status OK TO TRANSFUSE    Unit Number [id number]    Blood Component Type PLTP LR2 PAS    Unit division 00    Status of Unit ALLOCATED    Transfusion Status OK TO TRANSFUSE    Unit Number [id number]    Blood Component Type PLTP LR1 PAS    Unit division 00    Status of Unit REL FROM [facility]    Transfusion Status      OK TO TRANSFUSE Performed at [facility], [address], [place], [place] [address]   Prepare fresh frozen plasma     Status: None (Preliminary result)   Collection Time: 03/20/20  9:15 AM  Result Value Ref Range   Unit Number [id number]    Blood Component Type THAWED PLASMA    Unit division 00    Status of Unit ISSUED    Transfusion Status OK TO TRANSFUSE    Unit Number [id number]    Blood Component Type THAWED PLASMA    Unit division 00    Status of Unit ISSUED    Transfusion Status OK TO TRANSFUSE    Unit Number [id number]    Blood Component Type THW PLS APHR    Unit division A0    Status of Unit REL FROM [facility]    Transfusion Status      OK TO TRANSFUSE Performed at [facility], [address], [place], [place] [address]   Prepare RBC (crossmatch)     Status: None   Collection Time: 03/20/20  9:15 AM  Result Value Ref Range   Order Confirmation      ORDER PROCESSED BY BLOOD BANK Performed at [facility], [address] [place], [place] [address]   Fibrinogen (coagulopathy lab panel)     Status: None   Collection Time: 03/20/20 10:00 AM  Result Value Ref Range   Fibrinogen 273 210 - 475 mg/dL    Comment: Performed at [facility], [address] [place], [place] [address]  Protime-INR (coagulopathy lab panel)     Status: None   Collection Time: 03/20/20 10:00 AM   Result Value Ref Range   Prothrombin Time 15.1 11.4 - 15.2 seconds   INR 1.2 0.8 - 1.2    Comment: (NOTE) INR goal varies based on device and disease states. Performed at [facility], [address] [place], [place] [address]   APTT (coagulopathy lab panel)     Status: None   Collection Time: 03/20/20 10:00 AM  Result Value Ref Range   aPTT 25 24 - 36 seconds    Comment: Performed at [facility], [address] [place], [place] [address]  CBC with Differential/Platelet     Status: Abnormal   Collection Time: 03/20/20 10:00 AM  Result Value Ref Range   WBC 25.9 (H) 4.0 - 10.5 K/uL   RBC 2.46 (L) 3.87 - 5.11 MIL/uL   Hemoglobin 7.2 (L) 12.0 - 15.0 g/dL   HCT 73.7 (L) 10.6 - 26.9 %   MCV 87.4 80.0 - 100.0 fL   MCH 29.3 26.0 - 34.0 pg   MCHC 33.5 30.0 - 36.0 g/dL   RDW 48.5 46.2 - 70.3 %   Platelets 124 (L) 150 - 400 K/uL  Comment: Immature Platelet Fraction may be clinically indicated, consider ordering this additional test [id number]    nRBC 0.1 0.0 - 0.2 %   Neutrophils Relative % 82 %   Neutro Abs 21.0 (H) 1.7 - 7.7 K/uL   Lymphocytes Relative 10 %   Lymphs Abs 2.6 0.7 - 4.0 K/uL   Monocytes Relative 5 %   Monocytes Absolute 1.3 (H) 0.1 - 1.0 K/uL   Eosinophils Relative 0 %   Eosinophils Absolute 0.1 0.0 - 0.5 K/uL   Basophils Relative 0 %   Basophils Absolute 0.1 0.0 - 0.1 K/uL   WBC Morphology MORPHOLOGY UNREMARKABLE    RBC Morphology MORPHOLOGY UNREMARKABLE    Smear Review Normal platelet morphology    Immature Granulocytes 3 %   Abs Immature Granulocytes 0.79 (H) 0.00 - 0.07 K/uL    Comment: Performed at [facility], [address] [place], [place] [address]  Prepare RBC (crossmatch)     Status: None   Collection Time: 03/20/20 11:50 AM  Result Value Ref Range   Order Confirmation      DUPLICATE Performed at [facility], [address] [place], [place] [address]   Glucose, capillary     Status: Abnormal    Collection Time: 03/20/20 12:26 PM  Result Value Ref Range   Glucose-Capillary 163 (H) 70 - 99 mg/dL    Comment: Glucose reference range applies only to samples taken after fasting for at least 8 hours.  CBC     Status: Abnormal   Collection Time: 03/20/20  2:44 PM  Result Value Ref Range   WBC 19.7 (H) 4.0 - 10.5 K/uL   RBC 3.08 (L) 3.87 - 5.11 MIL/uL   Hemoglobin 9.2 (L) 12.0 - 15.0 g/dL    Comment: POST TRANSFUSION SPECIMEN   HCT 26.3 (L) 36.0 - 46.0 %   MCV 85.4 80.0 - 100.0 fL   MCH 29.9 26.0 - 34.0 pg   MCHC 35.0 30.0 - 36.0 g/dL   RDW 14.6 11.5 - 15.5 %   Platelets 91 (L) 150 - 400 K/uL    Comment: Immature Platelet Fraction may be clinically indicated, consider ordering this additional test [id number]    nRBC 0.0 0.0 - 0.2 %    Comment: Performed at [facility], [address] [place], [place] [address]  APTT     Status: None   Collection Time: 03/20/20  2:44 PM  Result Value Ref Range   aPTT 25 24 - 36 seconds    Comment: Performed at [facility], [address] [place], [place] [address]  Fibrinogen     Status: None   Collection Time: 03/20/20  2:44 PM  Result Value Ref Range   Fibrinogen 316 210 - 475 mg/dL    Comment: Performed at [facility], [address] [place], [place] [address]  Protime-INR     Status: None   Collection Time: 03/20/20  2:44 PM  Result Value Ref Range   Prothrombin Time 13.7 11.4 - 15.2 seconds   INR 1.1 0.8 - 1.2    Comment: (NOTE) INR goal varies based on device and disease states. Performed at [facility], [address] [place], [place] [address]   Comprehensive metabolic panel     Status: Abnormal   Collection Time: 03/20/20  2:44 PM  Result Value Ref Range   Sodium 135 135 - 145 mmol/L   Potassium 3.3 (L) 3.5 - 5.1 mmol/L   Chloride 105 98 - 111 mmol/L  CO2 23 22 - 32 mmol/L   Glucose, Bld 149 (H) 70 - 99 mg/dL    Comment: Glucose reference range applies only to samples taken  after fasting for at least 8 hours.   BUN 8 6 - 20 mg/dL   Creatinine, Ser 4.090.58 0.44 - 1.00 mg/dL   Calcium 6.6 (L) 8.9 - 10.3 mg/dL   Total Protein 3.8 (L) 6.5 - 8.1 g/dL   Albumin 2.0 (L) 3.5 - 5.0 g/dL   AST 27 15 - 41 U/L   ALT 21 0 - 44 U/L   Alkaline Phosphatase 53 38 - 126 U/L   Total Bilirubin 0.7 0.3 - 1.2 mg/dL   GFR calc non Af Amer >60 >60 mL/min   GFR calc Af Amer >60 >60 mL/min   Anion gap 7 5 - 15    Comment: Performed at [facility], [address] [place], [place] [id number]  Glucose, capillary     Status: Abnormal   Collection Time: 03/20/20  3:18 PM  Result Value Ref Range   Glucose-Capillary 125 (H) 70 - 99 mg/dL    Comment: Glucose reference range applies only to samples taken after fasting for at least 8 hours.   MR PELVIS WO CONTRAST  Result Date: 03/20/2020 CLINICAL DATA:  24 year old postpartum female status post vaginal delivery 03/17/2020, now with postpartum pain and vaginal bleeding. Clinical concern for inverted uterus. EXAM: MRI PELVIS WITHOUT CONTRAST TECHNIQUE: Multiplanar multisequence MR imaging of the pelvis was performed. No intravenous contrast was administered. COMPARISON:  03/18/2020 CT abdomen/pelvis. 03/20/2020 pelvic sonogram. FINDINGS: Urinary Tract: Urinary bladder relatively decompressed by indwelling Foley catheter. Small amount of gas in the nondependent bladder is expected given instrumentation. No bladder wall thickening. Normal visualized urethra. Bowel: Visualized small and large bowel are normal caliber with no bowel wall thickening. Vascular/Lymphatic: No pathologically enlarged lymph nodes in the pelvis. No evidence of acute vascular abnormality on this noncontrast scan. Reproductive: Uterus: The uterine corpus is inverted and prolapses through the uterine cervix into the vaginal fornix. The uterine corpus measures 15.7 x 8.9 x 9.5 cm. No uterine fibroids or other discrete uterine masses. The fibrous cervical stroma of the  uterine cervix appears discontinuous along the posterior wall (series 7/image 12). Ovaries and Adnexa: Ovaries are not discretely visualized. No adnexal masses. Other: No abnormal free fluid in the pelvis. No focal pelvic fluid collection. Musculoskeletal: No aggressive appearing focal osseous lesions. IMPRESSION: 1. Uterine corpus is inverted and prolapses through the uterine cervix into the vaginal fornix. 2. No uterine fibroids or other discrete uterine masses. 3. Ovaries are not discretely visualized. No adnexal masses. 4. Urinary bladder relatively decompressed by indwelling Foley catheter. Electronically Signed   By: Delbert PhenixJason A Poff M.D.   On: 03/20/2020 07:19   US PELVIS (TRANSABDOMINAL ONLY)  Result Date: 03/20/2020 CLINICAL DATA:  Excessive postpartum bleeding. History of vaginal delivery on March 17, 2020. EXAM: TRANSABDOMINAL ULTRASOUND OF PELVIS DOPPLER ULTRASOUND OF OVARIES TECHNIQUE: Transabdominal ultrasound examination of the pelvis was performed including evaluation of the uterus, ovaries, adnexal regions, and pelvic cul-de-sac. Color and duplex Doppler ultrasound was utilized to evaluate blood flow to the ovaries. COMPARISON:  None. FINDINGS: Uterus Measurements: 15.6 cm x 8.4 cm x 11.6 cm = volume: 794.8 mL. No fibroids or other mass visualized. Endometrium A 12.6 cm x 6.6 cm x 9.1 cm area of heterogeneous hyperechogenicity is seen within the endometrial canal. This demonstrates diffusely increased flow on color Doppler evaluation. Right ovary Measurements: 3.4 cm x 2.0  cm x 2.4 cm = volume: 8.4 mL. Normal appearance/no adnexal mass. Left ovary The left ovary is not visualized. Pulsed Doppler evaluation demonstrates normal low-resistance arterial and venous waveforms in both ovaries. Other: No free fluid is seen. IMPRESSION: 1. Findings consistent with retained products of conception within the endometrial canal. 2. Enlarged postpartum uterus. 3. Nonvisualization of the left ovary. Electronically  Signed   By: Aram Candela M.D.   On: 03/20/2020 02:33   CT ABDOMEN PELVIS W CONTRAST  Result Date: 03/18/2020 CLINICAL DATA:  Postpartum hemorrhage, gestational thrombocytopenia, status post blood transfusions. EXAM: CT ABDOMEN AND PELVIS WITH CONTRAST TECHNIQUE: Multidetector CT imaging of the abdomen and pelvis was performed using the standard protocol following bolus administration of intravenous contrast. CONTRAST:  OMNIPAQUE IOHEXOL 300 MG/ML  SOLN COMPARISON:  Pelvic ultrasound of 03/19/2018 [facility] FINDINGS: Lower chest: Unremarkable Hepatobiliary: Gallbladder wall thickening at about 8 mm in thickness. Possible sludge in the gallbladder. The liver appears normal. Pancreas: Unremarkable Spleen: Unremarkable Adrenals/Urinary Tract: Borderline bilateral hydronephrosis and mild proximal hydroureter extending to the iliac vessel cross over, where there is mass effect due to the enlarged uterine contour. There is a Foley catheter in the urinary bladder which is anteriorly displaced by the enlarged uterine contour. Stomach/Bowel: Unremarkable Vascular/Lymphatic: Unremarkable Reproductive: There is a masslike appearance along the posterior margin of the uterus along with heterogeneity of the uterus and a trace amount of postpartum gas along the endometrium near the fundus. The uterus including the masslike appearance which could be from hemorrhage within the endometrial cavity or along the posterior uterine margin measures 16.0 by 10.3 by 11.9 cm. A considerable central portion of this volume could be from hematoma or less likely retained products of conception, ultrasound might help in further characterization in this regard. I do not see any active extravasation of contrast within or along the uterus. Other: Small amount of free pelvic fluid in the paracolic gutters. Trace fluid in the presacral space. Musculoskeletal: S1 spina bifida occulta. Trace gas in the epidural space adjacent  to an epidural catheter. IMPRESSION: 1. Heterogeneous masslike appearance of the uterus. I cannot exclude endometrial or intramural hematoma or retained products of conception. The prominence is greater than I would expect in the normal postpartum setting. Consider sonography to further characterize and assess for any vascularity. No active extravasation of contrast identified. 2. Small amount of free pelvic fluid in the paracolic gutters. 3. Nonspecific gallbladder wall thickening. This can be seen in setting of hypoproteinemia/hypoalbuminemia or in the setting of and inflammation. 4. Borderline bilateral hydronephrosis and mild proximal hydroureter bilaterally due to mass effect from the large uterine contour slightly compressing the ureters at the iliac vessel cross over. 5. Trace amount of gas in the epidural space adjacent to the epidural catheter, likely benign/incidental. Electronically Signed   By: Gaylyn Rong M.D.   On: 03/18/2020 10:36     Discharge Diagnoses: Term Pregnancy-delivered and uterine inversion   Discharge Information: Date: 03/20/2020 Activity: pelvic rest Diet: routine Allergies as of 03/20/2020      Reactions   Sulfa Antibiotics Anaphylaxis   Amoxicillin Hives   Latex Itching, Rash   Penicillins Hives      Medication List    STOP taking these medications   Diclegis 10-10 MG Tbec Generic drug: Doxylamine-Pyridoxine   fluticasone 50 MCG/ACT nasal spray Commonly known as: FLONASE   multivitamin-prenatal 27-0.8 MG Tabs tablet   nitrofurantoin (macrocrystal-monohydrate) 100 MG capsule Commonly known as: MACROBID   promethazine  25 MG tablet Commonly known as: PHENERGAN       Condition: critical Discharge to: Transfer to [facility] Follow-up Information    Nadara Mustard, MD Follow up in 6 week(s).   Specialty: Obstetrics and Gynecology Why: postpartum visit Contact information: [address] [place] [phone]            Newborn Data: Live born female  Birth Weight:   APGAR: ,   Newborn Delivery   Birth date/time: 03/17/2020 16:23:00 Delivery type: Vaginal, Vacuum (Extractor)      Home.  Vena [place] 03/20/2020, 4:40 PM

## 2020-03-17 NOTE — Progress Notes (Signed)
   Subjective:  Comfortable with epidural in place  Objective:   Vitals: Blood pressure (!) 102/56, pulse 75, temperature 98.6 F (37 C), temperature source Oral, resp. rate 16, height 5\' 6"  (1.676 m), weight 96.6 kg, last menstrual period 06/15/2019, SpO2 92 %. General: NAD Abdomen: gravid, non-tender Cervical Exam:  Dilation: 4 Effacement (%): 80 Cervical Position: Middle Station: -2 Presentation: Vertex Exam by:: Chanisty RN   FHT: 145, moderate, +accels, no decels Toco: q36min  Results for orders placed or performed during the hospital encounter of 03/16/20 (from the past 24 hour(s))  CBC with Differential/Platelet     Status: Abnormal   Collection Time: 03/16/20  9:30 PM  Result Value Ref Range   WBC 13.7 (H) 4.0 - 10.5 K/uL   RBC 4.57 3.87 - 5.11 MIL/uL   Hemoglobin 13.8 12.0 - 15.0 g/dL   HCT 03/18/20 29.5 - 18.8 %   MCV 85.6 80.0 - 100.0 fL   MCH 30.2 26.0 - 34.0 pg   MCHC 35.3 30.0 - 36.0 g/dL   RDW 41.6 60.6 - 30.1 %   Platelets 100 (L) 150 - 400 K/uL   nRBC 0.0 0.0 - 0.2 %   Neutrophils Relative % 79 %   Neutro Abs 10.8 (H) 1.7 - 7.7 K/uL   Lymphocytes Relative 14 %   Lymphs Abs 1.9 0.7 - 4.0 K/uL   Monocytes Relative 7 %   Monocytes Absolute 1.0 0.1 - 1.0 K/uL   Eosinophils Relative 0 %   Eosinophils Absolute 0.0 0.0 - 0.5 K/uL   Basophils Relative 0 %   Basophils Absolute 0.0 0.0 - 0.1 K/uL   Immature Granulocytes 0 %   Abs Immature Granulocytes 0.05 0.00 - 0.07 K/uL  Immature Platelet Fraction     Status: Abnormal   Collection Time: 03/16/20  9:30 PM  Result Value Ref Range   Immature Platelet Fraction 16.3 (H) 1.2 - 8.6 %  CBC     Status: Abnormal   Collection Time: 03/16/20 11:30 PM  Result Value Ref Range   WBC 14.1 (H) 4.0 - 10.5 K/uL   RBC 4.77 3.87 - 5.11 MIL/uL   Hemoglobin 14.3 12.0 - 15.0 g/dL   HCT 03/18/20 09.3 - 23.5 %   MCV 85.1 80.0 - 100.0 fL   MCH 30.0 26.0 - 34.0 pg   MCHC 35.2 30.0 - 36.0 g/dL   RDW 57.3 22.0 - 25.4 %   Platelets  116 (L) 150 - 400 K/uL   nRBC 0.0 0.0 - 0.2 %    Assessment:   23 y.o. G1P0000 [redacted]w[redacted]d IOL gestational thrombocytopenia   Plan:   1) Labor - continue pitocon, SROM sometime this morning clear. IUPC placed.  Discussed close monitoring of labor progress.  Narrow pubic arch although good diagonal conjugate, non-prominent ischial spines   2) Fetus - cat I tracing  [redacted]w[redacted]d, MD, [facility] [place] [facility], [facility] 03/17/2020, 9:38 AM

## 2020-03-18 ENCOUNTER — Encounter: Payer: Self-pay | Admitting: Obstetrics and Gynecology

## 2020-03-18 ENCOUNTER — Encounter
Admission: RE | Disposition: A | Discharge status: Other Acute Inpatient Hospital | Payer: Self-pay | Source: Home / Self Care | Attending: Obstetrics and Gynecology

## 2020-03-18 ENCOUNTER — Inpatient Hospital Stay: Payer: Medicaid Other

## 2020-03-18 DIAGNOSIS — R571 Hypovolemic shock: Secondary | ICD-10-CM | POA: Diagnosis not present

## 2020-03-18 DIAGNOSIS — O99113 Other diseases of the blood and blood-forming organs and certain disorders involving the immune mechanism complicating pregnancy, third trimester: Secondary | ICD-10-CM | POA: Diagnosis not present

## 2020-03-18 DIAGNOSIS — D696 Thrombocytopenia, unspecified: Secondary | ICD-10-CM | POA: Diagnosis not present

## 2020-03-18 LAB — CBC
HCT: 30.2 % — ABNORMAL LOW (ref 36.0–46.0)
HCT: 26.8 % — ABNORMAL LOW (ref 36.0–46.0)
HCT: 25.2 % — ABNORMAL LOW (ref 36.0–46.0)
HCT: 21.4 % — ABNORMAL LOW (ref 36.0–46.0)
HCT: 20.3 % — ABNORMAL LOW (ref 36.0–46.0)
Hemoglobin: 10.6 g/dL — ABNORMAL LOW (ref 12.0–15.0)
Hemoglobin: 9.5 g/dL — ABNORMAL LOW (ref 12.0–15.0)
Hemoglobin: 8.7 g/dL — ABNORMAL LOW (ref 12.0–15.0)
Hemoglobin: 7.6 g/dL — ABNORMAL LOW (ref 12.0–15.0)
Hemoglobin: 7.2 g/dL — ABNORMAL LOW (ref 12.0–15.0)
MCH: 29.7 pg (ref 26.0–34.0)
MCH: 30.2 pg (ref 26.0–34.0)
MCH: 29.5 pg (ref 26.0–34.0)
MCH: 29.9 pg (ref 26.0–34.0)
MCH: 30.4 pg (ref 26.0–34.0)
MCHC: 35.1 g/dL (ref 30.0–36.0)
MCHC: 35.4 g/dL (ref 30.0–36.0)
MCHC: 34.5 g/dL (ref 30.0–36.0)
MCHC: 35.5 g/dL (ref 30.0–36.0)
MCHC: 35.5 g/dL (ref 30.0–36.0)
MCV: 84.6 fL (ref 80.0–100.0)
MCV: 85.1 fL (ref 80.0–100.0)
MCV: 85.4 fL (ref 80.0–100.0)
MCV: 84.3 fL (ref 80.0–100.0)
MCV: 85.7 fL (ref 80.0–100.0)
Platelets: 84 10*3/uL — ABNORMAL LOW (ref 150–400)
Platelets: 81 10*3/uL — ABNORMAL LOW (ref 150–400)
Platelets: 75 10*3/uL — ABNORMAL LOW (ref 150–400)
Platelets: 78 10*3/uL — ABNORMAL LOW (ref 150–400)
Platelets: 80 10*3/uL — ABNORMAL LOW (ref 150–400)
RBC: 3.57 MIL/uL — ABNORMAL LOW (ref 3.87–5.11)
RBC: 3.15 MIL/uL — ABNORMAL LOW (ref 3.87–5.11)
RBC: 2.95 MIL/uL — ABNORMAL LOW (ref 3.87–5.11)
RBC: 2.54 MIL/uL — ABNORMAL LOW (ref 3.87–5.11)
RBC: 2.37 MIL/uL — ABNORMAL LOW (ref 3.87–5.11)
RDW: 14.1 % (ref 11.5–15.5)
RDW: 14.1 % (ref 11.5–15.5)
RDW: 14.2 % (ref 11.5–15.5)
RDW: 14.2 % (ref 11.5–15.5)
RDW: 14.5 % (ref 11.5–15.5)
WBC: 21.8 10*3/uL — ABNORMAL HIGH (ref 4.0–10.5)
WBC: 20.9 10*3/uL — ABNORMAL HIGH (ref 4.0–10.5)
WBC: 17.2 10*3/uL — ABNORMAL HIGH (ref 4.0–10.5)
WBC: 16.7 10*3/uL — ABNORMAL HIGH (ref 4.0–10.5)
WBC: 16.5 10*3/uL — ABNORMAL HIGH (ref 4.0–10.5)
nRBC: 0 % (ref 0.0–0.2)
nRBC: 0 % (ref 0.0–0.2)
nRBC: 0 % (ref 0.0–0.2)
nRBC: 0 % (ref 0.0–0.2)
nRBC: 0 % (ref 0.0–0.2)

## 2020-03-18 LAB — COMPREHENSIVE METABOLIC PANEL
ALT: 18 U/L (ref 0–44)
AST: 32 U/L (ref 15–41)
Albumin: 1.9 g/dL — ABNORMAL LOW (ref 3.5–5.0)
Alkaline Phosphatase: 76 U/L (ref 38–126)
Anion gap: 6 (ref 5–15)
BUN: 15 mg/dL (ref 6–20)
CO2: 18 mmol/L — ABNORMAL LOW (ref 22–32)
Calcium: 8 mg/dL — ABNORMAL LOW (ref 8.9–10.3)
Chloride: 108 mmol/L (ref 98–111)
Creatinine, Ser: 0.92 mg/dL (ref 0.44–1.00)
GFR calc Af Amer: >60 mL/min (ref >60)
GFR calc non Af Amer: >60 mL/min (ref >60)
Glucose, Bld: 136 mg/dL — ABNORMAL HIGH (ref 70–99)
Potassium: 4 mmol/L (ref 3.5–5.1)
Sodium: 132 mmol/L — ABNORMAL LOW (ref 135–145)
Total Bilirubin: 0.8 mg/dL (ref 0.3–1.2)
Total Protein: 3.7 g/dL — ABNORMAL LOW (ref 6.5–8.1)

## 2020-03-18 LAB — FIBRINOGEN
Fibrinogen: 328 mg/dL (ref 210–475)
Fibrinogen: 366 mg/dL (ref 210–475)
Fibrinogen: 346 mg/dL (ref 210–475)

## 2020-03-18 LAB — BPAM FFP
Blood Product Expiration Date: 202105022359
ISSUE DATE / TIME: 202104271934
Unit Type and Rh: 5100

## 2020-03-18 LAB — PROTIME-INR
INR: 1.1 (ref 0.8–1.2)
Prothrombin Time: 14.2 seconds (ref 11.4–15.2)

## 2020-03-18 LAB — PROCALCITONIN: Procalcitonin: 0.57 ng/mL

## 2020-03-18 LAB — HIV ANTIBODY (ROUTINE TESTING W REFLEX): HIV Screen 4th Generation wRfx: NONREACTIVE

## 2020-03-18 LAB — MRSA PCR SCREENING: MRSA by PCR: NEGATIVE

## 2020-03-18 LAB — PREPARE FRESH FROZEN PLASMA

## 2020-03-18 SURGERY — EXAM UNDER ANESTHESIA
Anesthesia: Choice

## 2020-03-18 MED ORDER — OXYCODONE-ACETAMINOPHEN 5-325 MG PO TABS: 1.0000 | ORAL_TABLET | ORAL | Status: DC | PRN

## 2020-03-18 MED ORDER — HYDROMORPHONE 1 MG/ML IV SOLN
INTRAVENOUS | Status: DC
  Administered 2020-03-19: 0 mg via INTRAVENOUS
  Administered 2020-03-19: 1.2 mg via INTRAVENOUS
  Filled 2020-03-18 (×3): qty 30
  Filled 2020-03-18: qty 25

## 2020-03-18 MED ORDER — DIPHENHYDRAMINE HCL 25 MG PO CAPS
25.0000 mg | ORAL_CAPSULE | Freq: Four times a day (QID) | ORAL | Status: DC | PRN
  Filled 2020-03-18: qty 1

## 2020-03-18 MED ORDER — COCONUT OIL OIL
1.0000 "application " | TOPICAL_OIL | Status: DC | PRN
  Administered 2020-03-19: 1 via TOPICAL
  Filled 2020-03-18: qty 120

## 2020-03-18 MED ORDER — OXYCODONE-ACETAMINOPHEN 5-325 MG PO TABS
2.0000 | ORAL_TABLET | ORAL | Status: DC | PRN
  Administered 2020-03-20 (×2): 2 via ORAL
  Filled 2020-03-18: qty 2

## 2020-03-18 MED ORDER — DIBUCAINE (PERIANAL) 1 % EX OINT
1.0000 "application " | TOPICAL_OINTMENT | CUTANEOUS | Status: DC | PRN
  Administered 2020-03-18 – 2020-03-20 (×2): 1 via RECTAL
  Filled 2020-03-18 (×2): qty 28

## 2020-03-18 MED ORDER — WITCH HAZEL-GLYCERIN EX PADS
1.0000 "application " | MEDICATED_PAD | CUTANEOUS | Status: DC | PRN
  Administered 2020-03-18 – 2020-03-20 (×2): 1 via TOPICAL
  Filled 2020-03-18 (×2): qty 100

## 2020-03-18 MED ORDER — ZOLPIDEM TARTRATE 5 MG PO TABS: 5.0000 mg | ORAL_TABLET | Freq: Every evening | ORAL | Status: DC | PRN

## 2020-03-18 MED ORDER — SENNOSIDES-DOCUSATE SODIUM 8.6-50 MG PO TABS: 2.0000 | ORAL_TABLET | ORAL | Status: DC

## 2020-03-18 MED ORDER — DIPHENHYDRAMINE HCL 12.5 MG/5ML PO ELIX
12.5000 mg | ORAL_SOLUTION | Freq: Four times a day (QID) | ORAL | Status: DC | PRN
  Filled 2020-03-18: qty 5

## 2020-03-18 MED ORDER — ONDANSETRON HCL 4 MG/2ML IJ SOLN: 4.0000 mg | INTRAMUSCULAR | Status: DC | PRN

## 2020-03-18 MED ORDER — ONDANSETRON HCL 4 MG/2ML IJ SOLN: 4.0000 mg | Freq: Four times a day (QID) | INTRAMUSCULAR | Status: DC | PRN

## 2020-03-18 MED ORDER — SODIUM CHLORIDE 0.9% FLUSH
3.0000 mL | Freq: Two times a day (BID) | INTRAVENOUS | Status: DC
  Administered 2020-03-19: 20:00:00 3 mL via INTRAVENOUS

## 2020-03-18 MED ORDER — FENTANYL CITRATE (PF) 100 MCG/2ML IJ SOLN
50.0000 ug | Freq: Once | INTRAMUSCULAR | Status: AC
  Administered 2020-03-18: 50 ug via INTRAVENOUS
  Filled 2020-03-18: qty 2

## 2020-03-18 MED ORDER — BENZOCAINE-MENTHOL 20-0.5 % EX AERO
1.0000 "application " | INHALATION_SPRAY | CUTANEOUS | Status: DC | PRN
  Administered 2020-03-18 – 2020-03-20 (×2): 1 via TOPICAL
  Filled 2020-03-18 (×2): qty 56

## 2020-03-18 MED ORDER — IOHEXOL 300 MG/ML  SOLN
100.0000 mL | Freq: Once | INTRAMUSCULAR | Status: AC | PRN
  Administered 2020-03-18: 100 mL via INTRAVENOUS

## 2020-03-18 MED ORDER — DIPHENHYDRAMINE HCL 50 MG/ML IJ SOLN: 12.5000 mg | Freq: Four times a day (QID) | INTRAMUSCULAR | Status: DC | PRN

## 2020-03-18 MED ORDER — ONDANSETRON HCL 4 MG PO TABS: 4.0000 mg | ORAL_TABLET | ORAL | Status: DC | PRN

## 2020-03-18 MED ORDER — ACETAMINOPHEN 325 MG PO TABS
650.0000 mg | ORAL_TABLET | ORAL | Status: DC | PRN
  Administered 2020-03-18 – 2020-03-20 (×5): 650 mg via ORAL
  Filled 2020-03-18 (×6): qty 2

## 2020-03-18 MED ORDER — SODIUM CHLORIDE 0.9% FLUSH: 9.0000 mL | INTRAVENOUS | Status: DC | PRN

## 2020-03-18 MED ORDER — IBUPROFEN 400 MG PO TABS
600.0000 mg | ORAL_TABLET | Freq: Four times a day (QID) | ORAL | Status: DC
  Administered 2020-03-18 – 2020-03-20 (×8): 600 mg via ORAL
  Filled 2020-03-18 (×5): qty 1
  Filled 2020-03-18: qty 2
  Filled 2020-03-18 (×3): qty 1

## 2020-03-18 MED ORDER — SODIUM CHLORIDE 0.9 % IV SOLN: 250.0000 mL | INTRAVENOUS | Status: DC | PRN

## 2020-03-18 MED ORDER — SIMETHICONE 80 MG PO CHEW
80.0000 mg | CHEWABLE_TABLET | ORAL | Status: DC | PRN
  Filled 2020-03-18: qty 1

## 2020-03-18 MED ORDER — LACTATED RINGERS IV BOLUS
1000.0000 mL | Freq: Once | INTRAVENOUS | Status: AC
  Administered 2020-03-18: 1000 mL via INTRAVENOUS

## 2020-03-18 MED ORDER — SODIUM CHLORIDE 0.9% FLUSH: 3.0000 mL | INTRAVENOUS | Status: DC | PRN

## 2020-03-18 MED ORDER — NALOXONE HCL 0.4 MG/ML IJ SOLN: 0.4000 mg | INTRAMUSCULAR | Status: DC | PRN

## 2020-03-18 SURGICAL SUPPLY — 12 items
COVER WAND RF STERILE (DRAPES) ×3 IMPLANT
GLOVE BIO SURGEON STRL SZ8 (GLOVE) ×3 IMPLANT
GOWN STRL REUS W/ TWL LRG LVL3 (GOWN DISPOSABLE) ×1 IMPLANT
GOWN STRL REUS W/ TWL XL LVL3 (GOWN DISPOSABLE) ×1 IMPLANT
GOWN STRL REUS W/TWL LRG LVL3 (GOWN DISPOSABLE) ×2
GOWN STRL REUS W/TWL XL LVL3 (GOWN DISPOSABLE) ×2
NS IRRIG 500ML POUR BTL (IV SOLUTION) ×3 IMPLANT
PACK DNC HYST (MISCELLANEOUS) ×3 IMPLANT
PAD OB MATERNITY 4.3X12.25 (PERSONAL CARE ITEMS) ×3 IMPLANT
PAD PREP 24X41 OB/GYN DISP (PERSONAL CARE ITEMS) ×3 IMPLANT
STRAP SAFETY 5IN WIDE (MISCELLANEOUS) ×3 IMPLANT
TOWEL OR 17X26 4PK STRL BLUE (TOWEL DISPOSABLE) ×3 IMPLANT

## 2020-03-18 NOTE — Progress Notes (Signed)
Admit Date: 03/16/2020 Today's Date: 03/18/2020  Post Partum Day 1 VAVD Post Op Day 1 Vaginal repair of laceration  Subjective:  Mild crampy abdominal pains, occas vaginal slight pain.  Min bleeding noted.  No nausea.  + appetite.  Objective: Temp:  [97.7 F (36.5 C)-99.6 F (37.6 C)] 98.4 F (36.9 C) (04/28 1100) Pulse Rate:  [85-152] 98 (04/28 1100) Resp:  [7-20] 20 (04/28 1217) BP: (100-125)/(58-85) 100/64 (04/28 1100) SpO2:  [97 %-100 %] 98 % (04/28 1217) Arterial Line BP: (74-113)/(59-101) 105/70 (04/28 0800) FiO2 (%):  [97 %] 97 % (04/28 0402) Weight:  [96 kg] 96 kg (04/28 0500)  Physical Exam:  General: alert, cooperative and no distress Lochia: appropriate Uterine Fundus: firm, below umb Incision: none DVT Evaluation: No evidence of DVT seen on physical exam.  Recent Labs    03/18/20 0227 03/18/20 0641  HGB 9.5* 8.7*  HCT 26.8* 25.2*   CT ABDOMEN AND PELVIS WITH CONTRAST  TECHNIQUE: Multidetector CT imaging of the abdomen and pelvis was performed using the standard protocol following bolus administration of intravenous contrast.  CONTRAST:  OMNIPAQUE IOHEXOL 300 MG/ML  SOLN  COMPARISON:  Pelvic ultrasound of 03/19/2018 [facility]  FINDINGS: Lower chest: Unremarkable  Hepatobiliary: Gallbladder wall thickening at about 8 mm in thickness. Possible sludge in the gallbladder. The liver appears normal.  Pancreas: Unremarkable  Spleen: Unremarkable  Adrenals/Urinary Tract: Borderline bilateral hydronephrosis and mild proximal hydroureter extending to the iliac vessel cross over, where there is mass effect due to the enlarged uterine contour. There is a Foley catheter in the urinary bladder which is anteriorly displaced by the enlarged uterine contour.  Stomach/Bowel: Unremarkable  Vascular/Lymphatic: Unremarkable  Reproductive: There is a masslike appearance along the posterior margin of the uterus along with  heterogeneity of the uterus and a trace amount of postpartum gas along the endometrium near the fundus. The uterus including the masslike appearance which could be from hemorrhage within the endometrial cavity or along the posterior uterine margin measures 16.0 by 10.3 by 11.9 cm. A considerable central portion of this volume could be from hematoma or less likely retained products of conception, ultrasound might help in further characterization in this regard. I do not see any active extravasation of contrast within or along the uterus.  Other: Small amount of free pelvic fluid in the paracolic gutters. Trace fluid in the presacral space.  Musculoskeletal: S1 spina bifida occulta. Trace gas in the epidural space adjacent to an epidural catheter.  IMPRESSION: 1. Heterogeneous masslike appearance of the uterus. I cannot exclude endometrial or intramural hematoma or retained products of conception. The prominence is greater than I would expect in the normal postpartum setting. Consider sonography to further characterize and assess for any vascularity. No active extravasation of contrast identified. 2. Small amount of free pelvic fluid in the paracolic gutters. 3. Nonspecific gallbladder wall thickening. This can be seen in setting of hypoproteinemia/hypoalbuminemia or in the setting of and inflammation. 4. Borderline bilateral hydronephrosis and mild proximal hydroureter bilaterally due to mass effect from the large uterine contour slightly compressing the ureters at the iliac vessel cross over. 5. Trace amount of gas in the epidural space adjacent to the epidural catheter, likely benign/incidental.   Electronically Signed   By: Gaylyn Rong M.D.   On: 03/18/2020 10:36  Assessment/Plan: Breastfeeding and Infant doing well  Post Op Hemorrhage    Laceration repair     Blood products (yesterday)    Packing of vagina overnight (removed  this am)    Stabilized     Monitor anemia    Remove foley, epidural catheters    Iron    Diet    Ambulate Thrombocytopenia    Cont to follow levels CT- enlarged uterus c/w post partum changes but also retention of blood or clot.  Unlikely placental retained products. Monitor sx's and repeat CT vs [place] based on blood loss or pain.    LOS: 2 days   [place] 03/18/2020, 12:58 PM

## 2020-03-18 NOTE — Hospital Course (Addendum)
Received 4 U PRBCs and 1 U FFP during and after repair of vaginal laceration. Farrel Conners, CNM

## 2020-03-18 NOTE — Lactation Note (Signed)
This note was copied from a baby's chart. Lactation Consultation Note  Patient Name: Jocelyn Baldwin Date: 03/18/2020 Reason for consult: Initial assessment;Mother's request;1st time breastfeeding;Term;Other (Comment)(mom signifcant blood loss>3641mL (ICU stay); surgery)  LC called in to assist first time mom with first breastfeeding efforts post delivery. Mom suffered high blood loss levels (>3628mL), stayed in ICU, post delivery surgery due to hemorrhage/repair.  Mom was set-up with DEBP, and did attempt some pumping, although infrequently at this time due to her condition.  Baby Jocelyn Baldwin was skin to skin with mom sucking on hands. Mom requested to place baby in cradle/cross-cradle on left breast due to IV in right arm. LC assisted with transition, mom was laying back due to post-surgery comfort. LC assisted baby's back and neck/head, as well as held breast tissue in sandwich hold. Baby did get a few strong sucks after latching, but fell asleep quickly. LC discussed with mom a plan of action to assist in transitioning baby from bottle to breast, importance of stimulation through brief lick and learns, and pumping routine. Mom reports she is feeling better and ready to start her breastfeeding journey. Baby was left skin to skin on mom's chest, LC reviewed early vs late hunger cues, and provided call-out number when baby begins to cue again.  Plan: attempt latch/stimulation at breast with baby, followed by bottle/formula supplement PRN, and pumping stimulation every 2-3 hours. LC to follow-up.  Maternal Data Formula Feeding for Exclusion: No Has patient been taught Hand Expression?: Yes Does the patient have breastfeeding experience prior to this delivery?: No(first baby)  Feeding Feeding Type: Breast Fed  LATCH Score Latch: Repeated attempts needed to sustain latch, nipple held in mouth throughout feeding, stimulation needed to elicit sucking reflex.  Audible Swallowing:  None  Type of Nipple: Everted at rest and after stimulation  Comfort (Breast/Nipple): Soft / non-tender  Hold (Positioning): Assistance needed to correctly position infant at breast and maintain latch.  LATCH Score: 6  Interventions Interventions: Breast feeding basics reviewed;Assisted with latch;Skin to skin;Hand express;Support pillows;Adjust position;Position options;DEBP  Lactation Tools Discussed/Used Date initiated:: 03/17/20   Consult Status Consult Status: Follow-up Date: 03/18/20 Follow-up type: In-patient    [name] 03/18/2020, 10:23 AM

## 2020-03-18 NOTE — Anesthesia Postprocedure Evaluation (Signed)
Anesthesia Post Note  Patient: Jocelyn Baldwin  Procedure(s) Performed: REPAIR VAGINAL CUFF (N/A )  Patient location during evaluation: SICU Anesthesia Type: Epidural Level of consciousness: awake and alert and oriented Pain management: pain level controlled Vital Signs Assessment: post-procedure vital signs reviewed and stable Respiratory status: patient remains intubated per anesthesia plan Cardiovascular status: stable Postop Assessment: no apparent nausea or vomiting Anesthetic complications: no     Last Vitals:  Vitals:   03/18/20 0600 03/18/20 0813  BP: 112/77   Pulse: 93   Resp: 11 12  Temp:    SpO2: 100% 100%    Last Pain:  Vitals:   03/18/20 0813  TempSrc:   PainSc: 3                  Annesha Delgreco Lawerance Cruel

## 2020-03-18 NOTE — Lactation Note (Signed)
This note was copied from a baby's chart. Lactation Consultation Note  Patient Name: Jocelyn Baldwin [id number] Date: 03/18/2020 Reason for consult: Follow-up assessment;Mother's request  LC called in to assist with feeding on opposite breast. Baby was cuing while skin to skin on mom's chest. LC assisted with bringing baby into position on left breast (full assist due to mom's IV placement in L arm).  Baby grasped breast easily, had strong rhythmic sucking pattern, on/off breast for about 5 minutes before falling asleep.  Transitioned to mom's chest for continued skin to skin without waking. Dad and grandmother present in room to assist as needed.  Maternal Data Formula Feeding for Exclusion: No Has patient been taught Hand Expression?: Yes Does the patient have breastfeeding experience prior to this delivery?: No  Feeding Feeding Type: Breast Fed  LATCH Score Latch: Grasps breast easily, tongue down, lips flanged, rhythmical sucking.  Audible Swallowing: A few with stimulation  Type of Nipple: Everted at rest and after stimulation  Comfort (Breast/Nipple): Soft / non-tender  Hold (Positioning): Assistance needed to correctly position infant at breast and maintain latch.  LATCH Score: 8  Interventions Interventions: Breast feeding basics reviewed;Adjust position;Support pillows;Breast compression  Lactation Tools Discussed/Used Date initiated:: 03/17/20   Consult Status Consult Status: Follow-up Date: 03/18/20 Follow-up type: In-patient    [name] 03/18/2020, 12:07 PM

## 2020-03-18 NOTE — Lactation Note (Signed)
This note was copied from a baby's chart. Lactation Consultation Note  Patient Name: Jocelyn Baldwin RSWNI'O Date: 03/18/2020 Reason for consult: Follow-up assessment;Mother's request;1st time breastfeeding;Term;Other (Comment)  LC called in by family; baby began to cue. Mom had baby in cradle on right breast, turned in skin to skin. LC slightly adjusted position so nose was across from nipple, sandwiched breast tissue and baby easily grasped nipple and began strong rhythmic sucking pattern with flanged top/bottom lips, mom reporting tugging sensation. After a few a brief moment LC let go of breast tissue, and readjusted mom's arms to support baby along back and at back of head/neck. Baby continued to feed well with a few audible swallows and strong sucking pattern for 22 minutes. Mom began to feel cramps and became tired while feeding; indicating that breastfeeding was going well. LC reviewed some breastfeeding basics: baby's position at breast, preference and milk supply/demand, feeding patterns for first 24 hours, output expectations.  Encouraged continued feedings at the breast following hunger and fullness cues, DEBP use for additional stimulation and/or hand expression, and skin to skin as able to encourage onset of adequate milk supply. Mom and Mom's mom- (grandmother of baby), happy with feeding. Encouraged to continue calling out for support and assistance.   Maternal Data Formula Feeding for Exclusion: No Has patient been taught Hand Expression?: Yes Does the patient have breastfeeding experience prior to this delivery?: No  Feeding Feeding Type: Breast Fed  LATCH Score Latch: Grasps breast easily, tongue down, lips flanged, rhythmical sucking.  Audible Swallowing: A few with stimulation  Type of Nipple: Everted at rest and after stimulation  Comfort (Breast/Nipple): Soft / non-tender  Hold (Positioning): Assistance needed to correctly position infant at breast and  maintain latch.  LATCH Score: 8  Interventions Interventions: Breast feeding basics reviewed;Assisted with latch;Hand express;Support pillows;Adjust position  Lactation Tools Discussed/Used Date initiated:: 03/17/20   Consult Status Consult Status: Follow-up Date: 03/18/20 Follow-up type: In-patient    [name] 03/18/2020, 11:17 AM

## 2020-03-18 NOTE — Progress Notes (Signed)
Obstetric and Gynecology  Subjective  Did well overnight, still having cramping but was able to get some rest with dilaudid PCA.  No significant bleeding through packing note dovernight.  Good UOP without need for pressor support overnight.    Objective  Vital signs in last 24 hours: Temp:  [97.7 F (36.5 C)-99.6 F (37.6 C)] 97.9 F (36.6 C) (04/28 0400) Pulse Rate:  [79-152] 93 (04/28 0600) Resp:  [7-20] 11 (04/28 0600) BP: (100-125)/(58-85) 112/77 (04/28 0600) SpO2:  [97 %-100 %] 100 % (04/28 0600) Arterial Line BP: (74-113)/(59-101) 97/69 (04/28 0600) FiO2 (%):  [97 %] 97 % (04/28 0402) Weight:  [96 kg] 96 kg (04/28 0500)     Intake/Output Summary (Last 24 hours) at 03/18/2020 0807 Last data filed at 03/18/2020 0400 Gross per 24 hour  Intake 6488.56 ml  Output 8100 ml  Net -1611.44 ml    General: NAD Pulmonary: no increased work of breathing Abdomen: soft, non-tender, non-distended, fundus firm below umbilicus GU: Vaginal packing removed at bedside with Dr. Kenton Kingfisher after moistening with saline.  Packing was saturated with old blood but not active bleeding was noted following packing removal.  No clot with fundal check. Extremities: no edema  Labs: Results for orders placed or performed during the hospital encounter of 03/16/20 (from the past 24 hour(s))  Prepare RBC (crossmatch)     Status: None   Collection Time: 03/17/20  5:08 PM  Result Value Ref Range   Order Confirmation      ORDER PROCESSED BY BLOOD BANK Performed at [facility], [address] [place], [place] [address]   Prepare fresh frozen plasma     Status: None (Preliminary result)   Collection Time: 03/17/20  7:16 PM  Result Value Ref Range   Unit Number [id number]    Blood Component Type THW PLS APHR    Unit division A0    Status of Unit ISSUED    Transfusion Status      OK TO TRANSFUSE Performed at [facility], [address] [place], [place] [address]   APTT      Status: None   Collection Time: 03/17/20  7:39 PM  Result Value Ref Range   aPTT 25 24 - 36 seconds  Protime-INR     Status: Abnormal   Collection Time: 03/17/20  7:39 PM  Result Value Ref Range   Prothrombin Time 15.2 11.4 - 15.2 seconds   INR 1.3 (H) 0.8 - 1.2  Fibrin derivatives D-Dimer ([facility] only)     Status: Abnormal   Collection Time: 03/17/20  7:39 PM  Result Value Ref Range   Fibrin derivatives D-dimer ([facility]) 6,517.34 (H) 0.00 - 499.00 ng/mL (FEU)  CBC     Status: Abnormal   Collection Time: 03/17/20  7:39 PM  Result Value Ref Range   WBC 27.5 (H) 4.0 - 10.5 K/uL   RBC 2.81 (L) 3.87 - 5.11 MIL/uL   Hemoglobin 8.7 (L) 12.0 - 15.0 g/dL   HCT 25.3 (L) 36.0 - 46.0 %   MCV 90.0 80.0 - 100.0 fL   MCH 31.0 26.0 - 34.0 pg   MCHC 34.4 30.0 - 36.0 g/dL   RDW 13.5 11.5 - 15.5 %   Platelets 157 150 - 400 K/uL   nRBC 0.0 0.0 - 0.2 %  Glucose, capillary     Status: Abnormal   Collection Time: 03/17/20  8:27 PM  Result Value Ref Range   Glucose-Capillary 143 (H) 70 - 99 mg/dL  Fibrinogen  Status: None   Collection Time: 03/17/20 10:26 PM  Result Value Ref Range   Fibrinogen 309 210 - 475 mg/dL  Protime-INR     Status: None   Collection Time: 03/17/20 10:26 PM  Result Value Ref Range   Prothrombin Time 14.8 11.4 - 15.2 seconds   INR 1.2 0.8 - 1.2  Procalcitonin - Baseline     Status: None   Collection Time: 03/17/20 10:26 PM  Result Value Ref Range   Procalcitonin 0.29 ng/mL  CBC     Status: Abnormal   Collection Time: 03/17/20 10:26 PM  Result Value Ref Range   WBC 21.8 (H) 4.0 - 10.5 K/uL   RBC 3.57 (L) 3.87 - 5.11 MIL/uL   Hemoglobin 10.6 (L) 12.0 - 15.0 g/dL   HCT 18.2 (L) 99.3 - 71.6 %   MCV 84.6 80.0 - 100.0 fL   MCH 29.7 26.0 - 34.0 pg   MCHC 35.1 30.0 - 36.0 g/dL   RDW 96.7 89.3 - 81.0 %   Platelets 84 (L) 150 - 400 K/uL   nRBC 0.0 0.0 - 0.2 %  CBC     Status: Abnormal   Collection Time: 03/18/20  2:27 AM  Result Value Ref Range   WBC 20.9 (H) 4.0 - 10.5  K/uL   RBC 3.15 (L) 3.87 - 5.11 MIL/uL   Hemoglobin 9.5 (L) 12.0 - 15.0 g/dL   HCT 17.5 (L) 10.2 - 58.5 %   MCV 85.1 80.0 - 100.0 fL   MCH 30.2 26.0 - 34.0 pg   MCHC 35.4 30.0 - 36.0 g/dL   RDW 27.7 82.4 - 23.5 %   Platelets 81 (L) 150 - 400 K/uL   nRBC 0.0 0.0 - 0.2 %  Fibrinogen     Status: None   Collection Time: 03/18/20  2:27 AM  Result Value Ref Range   Fibrinogen 328 210 - 475 mg/dL  Comprehensive metabolic panel     Status: Abnormal   Collection Time: 03/18/20  2:27 AM  Result Value Ref Range   Sodium 132 (L) 135 - 145 mmol/L   Potassium 4.0 3.5 - 5.1 mmol/L   Chloride 108 98 - 111 mmol/L   CO2 18 (L) 22 - 32 mmol/L   Glucose, Bld 136 (H) 70 - 99 mg/dL   BUN 15 6 - 20 mg/dL   Creatinine, Ser 3.61 0.44 - 1.00 mg/dL   Calcium 8.0 (L) 8.9 - 10.3 mg/dL   Total Protein 3.7 (L) 6.5 - 8.1 g/dL   Albumin 1.9 (L) 3.5 - 5.0 g/dL   AST 32 15 - 41 U/L   ALT 18 0 - 44 U/L   Alkaline Phosphatase 76 38 - 126 U/L   Total Bilirubin 0.8 0.3 - 1.2 mg/dL   GFR calc non Af Amer >60 >60 mL/min   GFR calc Af Amer >60 >60 mL/min   Anion gap 6 5 - 15  MRSA PCR Screening     Status: None   Collection Time: 03/18/20  5:50 AM   Specimen: Nasopharyngeal  Result Value Ref Range   MRSA by PCR NEGATIVE NEGATIVE  CBC     Status: Abnormal   Collection Time: 03/18/20  6:41 AM  Result Value Ref Range   WBC 17.2 (H) 4.0 - 10.5 K/uL   RBC 2.95 (L) 3.87 - 5.11 MIL/uL   Hemoglobin 8.7 (L) 12.0 - 15.0 g/dL   HCT 44.3 (L) 15.4 - 00.8 %   MCV 85.4 80.0 - 100.0 fL  MCH 29.5 26.0 - 34.0 pg   MCHC 34.5 30.0 - 36.0 g/dL   RDW 93.2 35.5 - 73.2 %   Platelets 75 (L) 150 - 400 K/uL   nRBC 0.0 0.0 - 0.2 %  Fibrinogen     Status: None   Collection Time: 03/18/20  6:41 AM  Result Value Ref Range   Fibrinogen 366 210 - 475 mg/dL  Procalcitonin     Status: None   Collection Time: 03/18/20  6:41 AM  Result Value Ref Range   Procalcitonin 0.57 ng/mL    Cultures: Results for orders placed or performed  during the hospital encounter of 03/16/20  MRSA PCR Screening     Status: None   Collection Time: 03/18/20  5:50 AM   Specimen: Nasopharyngeal  Result Value Ref Range Status   MRSA by PCR NEGATIVE NEGATIVE Final    Comment:        The GeneXpert MRSA Assay (FDA approved for NASAL specimens only), is one component of a comprehensive MRSA colonization surveillance program. It is not intended to diagnose MRSA infection nor to guide or monitor treatment for MRSA infections. Performed at [facility], [address] [place], [place] [address]     Imaging:  Assessment   24 y.o. G1P1001  PPD#1 VAVD, and POD1 EUA and repair of vaginal laceration  Plan   1) Vaginal laceration  - packing removed (2 lap pads) - monitor bleeding through morning - H&H appropriate, no evidence of DIC, platelets appropriate in the setting of blood loss and gestational thrombocytopenia, vitals stable will obtain repeat CBC this afternoon - CT abdomen and pelvis to rule out retroperitoneal hematoma - Appreciated intensive care unit help in care of this patient.  Can transfer to floor as has not required pressure support overnight - transition of PCA later today   2) FEN - maintain foley for I&O monitoring and IV access - still clears for now may advance if bleeding stable  3) DVT ppx - SCD's

## 2020-03-18 NOTE — Progress Notes (Signed)
Vital signs reviewed, ICU needs resolved  Will sign off at this time. No further recommendations at this time.  Please call [phone] for further questions. Thank you.    Alajiah Dutkiewicz David Jax Kentner, M.D.  [facility]  Medical Director ICU-[facility] [place] Medical Director [facility] Cardio-Pulmonary Department   

## 2020-03-19 LAB — CBC
HCT: 21 % — ABNORMAL LOW (ref 36.0–46.0)
HCT: 18 % — ABNORMAL LOW (ref 36.0–46.0)
Hemoglobin: 7.2 g/dL — ABNORMAL LOW (ref 12.0–15.0)
Hemoglobin: 6.3 g/dL — ABNORMAL LOW (ref 12.0–15.0)
MCH: 29.6 pg (ref 26.0–34.0)
MCH: 30.1 pg (ref 26.0–34.0)
MCHC: 34.3 g/dL (ref 30.0–36.0)
MCHC: 35 g/dL (ref 30.0–36.0)
MCV: 86.4 fL (ref 80.0–100.0)
MCV: 86.1 fL (ref 80.0–100.0)
Platelets: 89 10*3/uL — ABNORMAL LOW (ref 150–400)
Platelets: 94 10*3/uL — ABNORMAL LOW (ref 150–400)
RBC: 2.43 MIL/uL — ABNORMAL LOW (ref 3.87–5.11)
RBC: 2.09 MIL/uL — ABNORMAL LOW (ref 3.87–5.11)
RDW: 14.3 % (ref 11.5–15.5)
RDW: 14.2 % (ref 11.5–15.5)
WBC: 16.9 10*3/uL — ABNORMAL HIGH (ref 4.0–10.5)
WBC: 15 10*3/uL — ABNORMAL HIGH (ref 4.0–10.5)
nRBC: 0 % (ref 0.0–0.2)
nRBC: 0 % (ref 0.0–0.2)

## 2020-03-19 LAB — PREPARE RBC (CROSSMATCH)

## 2020-03-19 LAB — PROCALCITONIN: Procalcitonin: 0.33 ng/mL

## 2020-03-19 MED ORDER — SODIUM CHLORIDE 0.9% IV SOLUTION: Freq: Once | INTRAVENOUS | Status: AC

## 2020-03-19 NOTE — Anesthesia Postprocedure Evaluation (Signed)
Anesthesia Post Note  Patient: Jocelyn Baldwin  Procedure(s) Performed: AN AD HOC LABOR EPIDURAL  Patient location during evaluation: Mother Baby Anesthesia Type: Epidural Level of consciousness: awake and alert Pain management: pain level controlled Vital Signs Assessment: post-procedure vital signs reviewed and stable Respiratory status: spontaneous breathing, nonlabored ventilation and respiratory function stable Cardiovascular status: stable Postop Assessment: no headache, no backache and epidural receding Anesthetic complications: no Comments: Epidural removed, tip intact.  No bleeding noted at site.      Last Vitals:  Vitals:   03/19/20 0409 03/19/20 0749  BP: 132/68 105/62  Pulse: (!) 116 96  Resp:  16  Temp: 37.3 C 37.2 C  SpO2: 100% 100%    Last Pain:  Vitals:   03/19/20 0749  TempSrc: Oral  PainSc:                  Elmarie Mainland

## 2020-03-19 NOTE — Progress Notes (Signed)
Patient reports pain level improving and is requesting discontinuation of PCA pump. She is tolerating regular diet. She is ambulating with assistance. She would like to take a shower later today. Foley catheter to remain in place until tomorrow morning at 6 AM per Dr Tiburcio Pea. Urine output has been good. Discontinue IV fluids at this time. Leave saline locked IVs indwelling. Patient is in good spirits and has been breastfeeding well with assistance from [facility]. Recommend re-check of platelets at 6 weeks postpartum.   Tresea Mall, CNM

## 2020-03-19 NOTE — Lactation Note (Signed)
This note was copied from a baby's chart. Lactation Consultation Note  Patient Name: Jocelyn Baldwin [id number] Date: 03/19/2020 Reason for consult: Follow-up assessment;1st time breastfeeding;Term;Other (Comment)(BL >3539mL, surgery repair)  LC in to see mom and baby. Baby active at the breast, parents reporting feeding starting at 9:30am, but baby falling asleep quickly. LC present for switch to right breast; baby grasped breast easily and began strong rhythmic sucking pattern that was brief, stayed at the breast for total of with assistance keeping baby awake. Baby removed from breast and placed skin to skin with mom. Mom reporting no pain/tenderness in the nipples this morning. Dad formula fed overnight for mom to rest and recover. Mom reports pumping at 5am with no visible output. LC encouraged continued breastfeeding on cue, skin to skin, and pumping every 3 hours today for ongoing efforts to build milk supply for baby's needs. Mom agreeable to feeding plan. LC to follow-up throughout the day, parents encouraged to call out if needed before.   Maternal Data Formula Feeding for Exclusion: No Has patient been taught Hand Expression?: Yes Does the patient have breastfeeding experience prior to this delivery?: No  Feeding Feeding Type: Breast Fed  LATCH Score Latch: Grasps breast easily, tongue down, lips flanged, rhythmical sucking.  Audible Swallowing: A few with stimulation  Type of Nipple: Everted at rest and after stimulation  Comfort (Breast/Nipple): Soft / non-tender  Hold (Positioning): Assistance needed to correctly position infant at breast and maintain latch.  LATCH Score: 8  Interventions Interventions: Breast feeding basics reviewed;Assisted with latch;Adjust position;Support pillows  Lactation Tools Discussed/Used     Consult Status Consult Status: Follow-up Date: 03/19/20 Follow-up type: In-patient    Jocelyn Baldwin 03/19/2020, 10:16  AM

## 2020-03-19 NOTE — Progress Notes (Signed)
Admit Date: 03/16/2020 Today's Date: 03/19/2020  Post Partum Day 2 VAVD POD#2 Vag Lac Repair  Subjective:  Increased strength, ambulating well, no dizziness, Min VB/Lochia, Breast feeding well.  She was unable to void on 2 occasions during the night, so foley is currently in place.  Objective: Temp:  [98.3 F (36.8 C)-100.1 F (37.8 C)] 99 F (37.2 C) (04/29 0749) Pulse Rate:  [96-118] 96 (04/29 0749) Resp:  [14-20] 14 (04/29 0909) BP: (100-132)/(54-75) 105/62 (04/29 0749) SpO2:  [97 %-100 %] 100 % (04/29 4696)  Physical Exam:  General: alert, cooperative and no distress Lochia: appropriate Uterine Fundus: firm and smaller than yesterday exam Incision: none DVT Evaluation: No evidence of DVT seen on physical exam. Negative Homan's sign. No cords or calf tenderness. No significant calf/ankle edema.  Recent Labs    03/18/20 1845 03/19/20 0550  HGB 7.2* 7.2*  HCT 20.3* 21.0*    Assessment/Plan: Breastfeeding and Infant doing well  CBC stable, Plt count stable, no signs of current internal or vaginal hemorrhage    Based on exam and how she is doing, unlikely woresning concerns with vaginal laceration and surgery the other day.  If retroperitoneal hematoma, then would expect more sx's or lab changes.  Conservative monitoring recommended. Cont to breast feed w lactation support; she has had to supplement Voiding trial in am (give rest today).  Likely urinary retention related to vaginal trauma.  Needs time to have less irritation or stimulation.  Clear urine, good UOP, unlikely internal bladder concern.   LOS: 3 days   Letitia Libra [facility] 03/19/2020, 9:40 AM

## 2020-03-19 NOTE — Lactation Note (Signed)
This note was copied from a baby's chart. Lactation Consultation Note  Patient Name: Jocelyn Baldwin OHYWV'P Date: 03/19/2020 Reason for consult: Mother's request;Follow-up assessment;1st time breastfeeding;Term  LC called in for assistance in feeding. Mom was up in chair at bedside with support pillows aiming for cross-cradle latch, baby was awake, crying, having trouble calming down. LC placed baby skin to skin to calm baby, then gently assisted with moving baby down to breast. Baby did latch easily, but became content/sleepy quickly. LC used breast compression, massage, and hand expression to keep baby at breast.  Baby let go of breast and laid quietly against mom. LC hand expressed a drop of colostrum and rubbed on mom's nipple.  Encouraged to keep baby skin to skin, mindful of early hunger cues, offering breast first. Mom hasn't pumped today, discussed the benefits of pumping for continued stimulation and/or use in place of formula supplement if needed. Mom acknowledges information and will try to begin pumping schedule. Encouraged to call out for continued assistance as needed.  Maternal Data Formula Feeding for Exclusion: No Has patient been taught Hand Expression?: Yes Does the patient have breastfeeding experience prior to this delivery?: No  Feeding Feeding Type: Breast Fed Nipple Type: Slow - flow  LATCH Score Latch: Grasps breast easily, tongue down, lips flanged, rhythmical sucking.  Audible Swallowing: A few with stimulation  Type of Nipple: Everted at rest and after stimulation  Comfort (Breast/Nipple): Soft / non-tender  Hold (Positioning): Assistance needed to correctly position infant at breast and maintain latch.  LATCH Score: 8  Interventions Interventions: Breast feeding basics reviewed;Hand express;Breast compression;Adjust position;Support pillows  Lactation Tools Discussed/Used     Consult Status Consult Status: Follow-up Date:  03/19/20 Follow-up type: In-patient    [name] 03/19/2020, 2:34 PM

## 2020-03-20 ENCOUNTER — Encounter: Payer: Self-pay | Admitting: Obstetrics and Gynecology

## 2020-03-20 ENCOUNTER — Inpatient Hospital Stay: Payer: Medicaid Other

## 2020-03-20 ENCOUNTER — Encounter
Admission: RE | Disposition: A | Discharge status: Other Acute Inpatient Hospital | Payer: Self-pay | Source: Home / Self Care | Attending: Obstetrics and Gynecology

## 2020-03-20 ENCOUNTER — Inpatient Hospital Stay: Payer: Medicaid Other | Admitting: Anesthesiology

## 2020-03-20 DIAGNOSIS — N855 Inversion of uterus: Secondary | ICD-10-CM

## 2020-03-20 HISTORY — PX: LAPAROTOMY: SHX154

## 2020-03-20 LAB — MASSIVE TRANSFUSION PROTOCOL ORDER (BLOOD BANK NOTIFICATION)

## 2020-03-20 LAB — TYPE AND SCREEN
ABO/RH(D): O POS
Antibody Screen: NEGATIVE
Unit division: 0
Unit division: 0
Unit division: 0
Unit division: 0
Unit division: 0
Unit division: 0

## 2020-03-20 LAB — CBC WITH DIFFERENTIAL/PLATELET
Abs Immature Granulocytes: 0.79 10*3/uL — ABNORMAL HIGH (ref 0.00–0.07)
Basophils Absolute: 0.1 10*3/uL (ref 0.0–0.1)
Basophils Relative: 0 %
Eosinophils Absolute: 0.1 10*3/uL (ref 0.0–0.5)
Eosinophils Relative: 0 %
HCT: 21.5 % — ABNORMAL LOW (ref 36.0–46.0)
Hemoglobin: 7.2 g/dL — ABNORMAL LOW (ref 12.0–15.0)
Immature Granulocytes: 3 %
Lymphocytes Relative: 10 %
Lymphs Abs: 2.6 10*3/uL (ref 0.7–4.0)
MCH: 29.3 pg (ref 26.0–34.0)
MCHC: 33.5 g/dL (ref 30.0–36.0)
MCV: 87.4 fL (ref 80.0–100.0)
Monocytes Absolute: 1.3 10*3/uL — ABNORMAL HIGH (ref 0.1–1.0)
Monocytes Relative: 5 %
Neutro Abs: 21 10*3/uL — ABNORMAL HIGH (ref 1.7–7.7)
Neutrophils Relative %: 82 %
Platelets: 124 10*3/uL — ABNORMAL LOW (ref 150–400)
RBC: 2.46 MIL/uL — ABNORMAL LOW (ref 3.87–5.11)
RDW: 14.2 % (ref 11.5–15.5)
Smear Review: NORMAL
WBC: 25.9 10*3/uL — ABNORMAL HIGH (ref 4.0–10.5)
nRBC: 0.1 % (ref 0.0–0.2)

## 2020-03-20 LAB — BPAM RBC
Blood Product Expiration Date: 202105312359
Blood Product Expiration Date: 202105312359
Blood Product Expiration Date: 202105312359
Blood Product Expiration Date: 202106012359
Blood Product Expiration Date: 202106012359
Blood Product Expiration Date: 202106012359
ISSUE DATE / TIME: 202104271754
ISSUE DATE / TIME: 202104271846
ISSUE DATE / TIME: 202104271934
ISSUE DATE / TIME: 202104271934
ISSUE DATE / TIME: 202104292136
ISSUE DATE / TIME: 202104300745
Unit Type and Rh: 5100
Unit Type and Rh: 5100
Unit Type and Rh: 5100
Unit Type and Rh: 5100
Unit Type and Rh: 5100
Unit Type and Rh: 5100

## 2020-03-20 LAB — COMPREHENSIVE METABOLIC PANEL
ALT: 21 U/L (ref 0–44)
AST: 27 U/L (ref 15–41)
Albumin: 2 g/dL — ABNORMAL LOW (ref 3.5–5.0)
Alkaline Phosphatase: 53 U/L (ref 38–126)
Anion gap: 7 (ref 5–15)
BUN: 8 mg/dL (ref 6–20)
CO2: 23 mmol/L (ref 22–32)
Calcium: 6.6 mg/dL — ABNORMAL LOW (ref 8.9–10.3)
Chloride: 105 mmol/L (ref 98–111)
Creatinine, Ser: 0.58 mg/dL (ref 0.44–1.00)
GFR calc Af Amer: >60 mL/min (ref >60)
GFR calc non Af Amer: >60 mL/min (ref >60)
Glucose, Bld: 149 mg/dL — ABNORMAL HIGH (ref 70–99)
Potassium: 3.3 mmol/L — ABNORMAL LOW (ref 3.5–5.1)
Sodium: 135 mmol/L (ref 135–145)
Total Bilirubin: 0.7 mg/dL (ref 0.3–1.2)
Total Protein: 3.8 g/dL — ABNORMAL LOW (ref 6.5–8.1)

## 2020-03-20 LAB — CBC
HCT: 21.1 % — ABNORMAL LOW (ref 36.0–46.0)
HCT: 26.3 % — ABNORMAL LOW (ref 36.0–46.0)
Hemoglobin: 7.2 g/dL — ABNORMAL LOW (ref 12.0–15.0)
Hemoglobin: 9.2 g/dL — ABNORMAL LOW (ref 12.0–15.0)
MCH: 30.1 pg (ref 26.0–34.0)
MCH: 29.9 pg (ref 26.0–34.0)
MCHC: 34.1 g/dL (ref 30.0–36.0)
MCHC: 35 g/dL (ref 30.0–36.0)
MCV: 88.3 fL (ref 80.0–100.0)
MCV: 85.4 fL (ref 80.0–100.0)
Platelets: 98 10*3/uL — ABNORMAL LOW (ref 150–400)
Platelets: 91 10*3/uL — ABNORMAL LOW (ref 150–400)
RBC: 2.39 MIL/uL — ABNORMAL LOW (ref 3.87–5.11)
RBC: 3.08 MIL/uL — ABNORMAL LOW (ref 3.87–5.11)
RDW: 14.1 % (ref 11.5–15.5)
RDW: 14.6 % (ref 11.5–15.5)
WBC: 13.3 10*3/uL — ABNORMAL HIGH (ref 4.0–10.5)
WBC: 19.7 10*3/uL — ABNORMAL HIGH (ref 4.0–10.5)
nRBC: 0 % (ref 0.0–0.2)
nRBC: 0 % (ref 0.0–0.2)

## 2020-03-20 LAB — PREPARE RBC (CROSSMATCH)

## 2020-03-20 LAB — APTT
aPTT: 25 seconds (ref 24–36)
aPTT: 25 seconds (ref 24–36)
aPTT: 25 seconds (ref 24–36)

## 2020-03-20 LAB — PROTIME-INR
INR: 0.9 (ref 0.8–1.2)
INR: 1.2 (ref 0.8–1.2)
INR: 1.1 (ref 0.8–1.2)
Prothrombin Time: 12.1 seconds (ref 11.4–15.2)
Prothrombin Time: 15.1 seconds (ref 11.4–15.2)
Prothrombin Time: 13.7 seconds (ref 11.4–15.2)

## 2020-03-20 LAB — GLUCOSE, CAPILLARY
Glucose-Capillary: 163 mg/dL — ABNORMAL HIGH (ref 70–99)
Glucose-Capillary: 125 mg/dL — ABNORMAL HIGH (ref 70–99)

## 2020-03-20 LAB — FIBRINOGEN
Fibrinogen: 508 mg/dL — ABNORMAL HIGH (ref 210–475)
Fibrinogen: 273 mg/dL (ref 210–475)
Fibrinogen: 316 mg/dL (ref 210–475)

## 2020-03-20 SURGERY — LAPAROTOMY, EXPLORATORY
Anesthesia: General | Site: Abdomen

## 2020-03-20 MED ORDER — SODIUM CHLORIDE 0.9 % IV SOLN
INTRAVENOUS | Status: DC | PRN
  Administered 2020-03-20: 40 ug/min via INTRAVENOUS

## 2020-03-20 MED ORDER — SUCCINYLCHOLINE CHLORIDE 20 MG/ML IJ SOLN
INTRAMUSCULAR | Status: DC | PRN
  Administered 2020-03-20: 120 mg via INTRAVENOUS

## 2020-03-20 MED ORDER — LACTATED RINGERS IV SOLN
INTRAVENOUS | Status: DC
Start: ? — End: 2020-03-20

## 2020-03-20 MED ORDER — GENERIC EXTERNAL MEDICATION
Status: DC
Start: ? — End: 2020-03-20

## 2020-03-20 MED ORDER — EPHEDRINE 5 MG/ML INJ
INTRAVENOUS | Status: AC
  Filled 2020-03-20: qty 10

## 2020-03-20 MED ORDER — CLINDAMYCIN PHOSPHATE 900 MG/50ML IV SOLN
900.0000 mg | INTRAVENOUS | Status: AC
  Administered 2020-03-20: 480 mg via INTRAVENOUS
  Administered 2020-03-20: 900 mg via INTRAVENOUS
  Filled 2020-03-20: qty 50

## 2020-03-20 MED ORDER — HYDROMORPHONE HCL 2 MG PO TABS: 2.0000 mg | ORAL_TABLET | Freq: Once | ORAL | Status: DC

## 2020-03-20 MED ORDER — MAGNESIUM SULFATE 40 GM/1000ML IV SOLN
INTRAVENOUS | Status: AC
  Filled 2020-03-20: qty 1000

## 2020-03-20 MED ORDER — GENTAMICIN SULFATE 40 MG/ML IJ SOLN
5.0000 mg/kg | INTRAVENOUS | Status: AC
  Administered 2020-03-20: 7680 mg via INTRAVENOUS
  Filled 2020-03-20: qty 12

## 2020-03-20 MED ORDER — TERBUTALINE SULFATE 1 MG/ML IJ SOLN
INTRAMUSCULAR | Status: AC
  Filled 2020-03-20: qty 1

## 2020-03-20 MED ORDER — NITROGLYCERIN IN D5W 200-5 MCG/ML-% IV SOLN
INTRAVENOUS | Status: AC
  Filled 2020-03-20: qty 250

## 2020-03-20 MED ORDER — MIDAZOLAM HCL 2 MG/2ML IJ SOLN
INTRAMUSCULAR | Status: DC | PRN
  Administered 2020-03-20 (×2): 1 mg via INTRAVENOUS

## 2020-03-20 MED ORDER — MAGNESIUM SULFATE 40 GM/1000ML IV SOLN
INTRAVENOUS | Status: DC | PRN
  Administered 2020-03-20: 2 g/h via INTRAVENOUS

## 2020-03-20 MED ORDER — PROPOFOL 10 MG/ML IV BOLUS
INTRAVENOUS | Status: DC | PRN
  Administered 2020-03-20: 150 mg via INTRAVENOUS

## 2020-03-20 MED ORDER — GENTAMICIN SULFATE 40 MG/ML IJ SOLN
1.5000 mg/kg | Freq: Three times a day (TID) | INTRAVENOUS | Status: DC
  Administered 2020-03-20: 16:00:00 110 mg via INTRAVENOUS
  Filled 2020-03-20 (×3): qty 2.75

## 2020-03-20 MED ORDER — LIDOCAINE HCL (CARDIAC) PF 100 MG/5ML IV SOSY: PREFILLED_SYRINGE | INTRAVENOUS | Status: DC | PRN

## 2020-03-20 MED ORDER — LIDOCAINE HCL (PF) 2 % IJ SOLN
INTRAMUSCULAR | Status: AC
  Filled 2020-03-20: qty 5

## 2020-03-20 MED ORDER — NALOXONE HCL 0.4 MG/ML IJ SOLN
0.40 | INTRAMUSCULAR | Status: DC
Start: ? — End: 2020-03-20

## 2020-03-20 MED ORDER — NITROGLYCERIN FOR UTERINE RELAXATION OPTIME 200MCG/ML
100.0000 ug | Freq: Every day | INTRAVENOUS | Status: DC | PRN
  Administered 2020-03-20: 100 ug via INTRAVENOUS
  Administered 2020-03-20 (×3): 50 ug via INTRAVENOUS
  Filled 2020-03-20: qty 250

## 2020-03-20 MED ORDER — FENTANYL CITRATE (PF) 100 MCG/2ML IJ SOLN
INTRAMUSCULAR | Status: AC
  Administered 2020-03-20: 11:00:00 50 ug via INTRAVENOUS
  Filled 2020-03-20: qty 2

## 2020-03-20 MED ORDER — OXYCODONE-ACETAMINOPHEN 5-325 MG PO TABS
ORAL_TABLET | ORAL | Status: AC
  Filled 2020-03-20: qty 2

## 2020-03-20 MED ORDER — ONDANSETRON HCL 4 MG/2ML IJ SOLN
4.00 | INTRAMUSCULAR | Status: DC
Start: ? — End: 2020-03-20

## 2020-03-20 MED ORDER — SODIUM CHLORIDE 0.9% IV SOLUTION: Freq: Once | INTRAVENOUS | Status: AC

## 2020-03-20 MED ORDER — MISOPROSTOL 200 MCG PO TABS
ORAL_TABLET | ORAL | Status: AC
  Filled 2020-03-20: qty 4

## 2020-03-20 MED ORDER — PROPOFOL 10 MG/ML IV BOLUS
INTRAVENOUS | Status: AC
  Filled 2020-03-20: qty 20

## 2020-03-20 MED ORDER — FENTANYL CITRATE (PF) 100 MCG/2ML IJ SOLN
INTRAMUSCULAR | Status: AC
  Filled 2020-03-20: qty 2

## 2020-03-20 MED ORDER — PHENYLEPHRINE HCL (PRESSORS) 10 MG/ML IV SOLN
INTRAVENOUS | Status: AC
  Filled 2020-03-20: qty 1

## 2020-03-20 MED ORDER — LIDOCAINE HCL (CARDIAC) PF 100 MG/5ML IV SOSY
PREFILLED_SYRINGE | INTRAVENOUS | Status: DC | PRN
  Administered 2020-03-20: 100 mg via INTRAVENOUS

## 2020-03-20 MED ORDER — LACTATED RINGERS IV SOLN: INTRAVENOUS | Status: DC

## 2020-03-20 MED ORDER — FENTANYL CITRATE (PF) 100 MCG/2ML IJ SOLN
INTRAMUSCULAR | Status: DC | PRN
  Administered 2020-03-20: 25 ug via INTRAVENOUS
  Administered 2020-03-20 (×3): 50 ug via INTRAVENOUS
  Administered 2020-03-20: 25 ug via INTRAVENOUS

## 2020-03-20 MED ORDER — FENTANYL CITRATE (PF) 100 MCG/2ML IJ SOLN
INTRAMUSCULAR | Status: AC
  Administered 2020-03-20: 50 ug via INTRAVENOUS
  Filled 2020-03-20: qty 2

## 2020-03-20 MED ORDER — FENTANYL CITRATE (PF) 100 MCG/2ML IJ SOLN
25.0000 ug | INTRAMUSCULAR | Status: DC | PRN
  Administered 2020-03-20: 11:00:00 50 ug via INTRAVENOUS

## 2020-03-20 MED ORDER — ACETAMINOPHEN 325 MG PO TABS
650.00 | ORAL_TABLET | ORAL | Status: DC
Start: ? — End: 2020-03-20

## 2020-03-20 MED ORDER — FENTANYL CITRATE (PF) 100 MCG/2ML IJ SOLN
50.0000 ug | Freq: Once | INTRAMUSCULAR | Status: AC
  Administered 2020-03-20: 11:00:00 50 ug via INTRAVENOUS

## 2020-03-20 MED ORDER — SODIUM CHLORIDE 0.9 % IV SOLN
INTRAVENOUS | Status: DC
Start: ? — End: 2020-03-20

## 2020-03-20 MED ORDER — SODIUM CHLORIDE 0.9% IV SOLUTION: Freq: Once | INTRAVENOUS | Status: DC

## 2020-03-20 MED ORDER — PHENYLEPHRINE HCL (PRESSORS) 10 MG/ML IV SOLN
INTRAVENOUS | Status: DC | PRN
  Administered 2020-03-20 (×2): 200 ug via INTRAVENOUS
  Administered 2020-03-20: 300 ug via INTRAVENOUS
  Administered 2020-03-20: 100 ug via INTRAVENOUS
  Administered 2020-03-20: 300 ug via INTRAVENOUS
  Administered 2020-03-20 (×3): 200 ug via INTRAVENOUS

## 2020-03-20 MED ORDER — MAGNESIUM SULFATE 50 % IJ SOLN
INTRAMUSCULAR | Status: DC | PRN
  Administered 2020-03-20: 2 g via INTRAVENOUS
  Administered 2020-03-20: 4 g via INTRAVENOUS

## 2020-03-20 MED ORDER — HYDROMORPHONE HCL 1 MG/ML IJ SOLN
INTRAMUSCULAR | Status: AC
  Administered 2020-03-20: 0.5 mg via INTRAVENOUS
  Filled 2020-03-20: qty 1

## 2020-03-20 MED ORDER — HYDROMORPHONE HCL 1 MG/ML IJ SOLN
0.5000 mg | INTRAMUSCULAR | Status: AC | PRN
  Administered 2020-03-20 (×2): 0.5 mg via INTRAVENOUS

## 2020-03-20 MED ORDER — ROCURONIUM BROMIDE 100 MG/10ML IV SOLN
INTRAVENOUS | Status: DC | PRN
  Administered 2020-03-20: 20 mg via INTRAVENOUS
  Administered 2020-03-20: 45 mg via INTRAVENOUS
  Administered 2020-03-20: 5 mg via INTRAVENOUS

## 2020-03-20 MED ORDER — NITROGLYCERIN IN D5W 200-5 MCG/ML-% IV SOLN
INTRAVENOUS | Status: DC | PRN
  Administered 2020-03-20: 4 ug/kg/min via INTRAVENOUS

## 2020-03-20 MED ORDER — MIDAZOLAM HCL 2 MG/2ML IJ SOLN
INTRAMUSCULAR | Status: AC
  Filled 2020-03-20: qty 2

## 2020-03-20 MED ORDER — LACTATED RINGERS IV SOLN: INTRAVENOUS | Status: DC | PRN

## 2020-03-20 MED ORDER — HYDROMORPHONE HCL 1 MG/ML IJ SOLN
1.0000 mg | Freq: Once | INTRAMUSCULAR | Status: AC
  Administered 2020-03-20: 13:00:00 1 mg via INTRAVENOUS
  Filled 2020-03-20: qty 1

## 2020-03-20 MED ORDER — OXYTOCIN 40 UNITS IN NORMAL SALINE INFUSION - SIMPLE MED
INTRAVENOUS | Status: AC
  Filled 2020-03-20: qty 1000

## 2020-03-20 MED ORDER — GENTAMICIN SULFATE 40 MG/ML IJ SOLN
1.5000 mg/kg | Freq: Three times a day (TID) | INTRAVENOUS | Status: DC
  Filled 2020-03-20 (×2): qty 2.75

## 2020-03-20 MED ORDER — VASOPRESSIN 20 UNIT/ML IV SOLN
INTRAVENOUS | Status: DC | PRN
  Administered 2020-03-20 (×5): 2 [IU] via INTRAVENOUS

## 2020-03-20 MED ORDER — SUGAMMADEX SODIUM 500 MG/5ML IV SOLN
INTRAVENOUS | Status: DC | PRN
  Administered 2020-03-20: 200 mg via INTRAVENOUS

## 2020-03-20 MED ORDER — HYDROMORPHONE HCL 1 MG/ML IJ SOLN
INTRAMUSCULAR | Status: AC
  Administered 2020-03-20: 12:00:00 0.5 mg via INTRAVENOUS
  Filled 2020-03-20: qty 1

## 2020-03-20 MED ORDER — PROMETHAZINE HCL 25 MG/ML IJ SOLN: 6.2500 mg | INTRAMUSCULAR | Status: DC | PRN

## 2020-03-20 MED ORDER — CHLORHEXIDINE GLUCONATE CLOTH 2 % EX PADS: 6.0000 | MEDICATED_PAD | Freq: Every day | CUTANEOUS | Status: DC

## 2020-03-20 MED ORDER — METHYLERGONOVINE MALEATE 0.2 MG/ML IJ SOLN
INTRAMUSCULAR | Status: AC
  Filled 2020-03-20: qty 1

## 2020-03-20 MED ORDER — CARBOPROST TROMETHAMINE 250 MCG/ML IM SOLN
INTRAMUSCULAR | Status: AC
  Filled 2020-03-20: qty 1

## 2020-03-20 MED ORDER — TERBUTALINE SULFATE 1 MG/ML IJ SOLN
INTRAMUSCULAR | Status: DC | PRN
  Administered 2020-03-20 (×2): .25 ug via SUBCUTANEOUS

## 2020-03-20 MED ORDER — HYDROMORPHONE HCL-NACL 30-0.9 MG/30ML-% IJ SOSY
0.40 | PREFILLED_SYRINGE | INTRAMUSCULAR | Status: DC
Start: ? — End: 2020-03-20

## 2020-03-20 MED ORDER — CLINDAMYCIN PHOSPHATE 900 MG/50ML IV SOLN
900.0000 mg | Freq: Three times a day (TID) | INTRAVENOUS | Status: DC
  Administered 2020-03-20: 900 mg via INTRAVENOUS
  Filled 2020-03-20 (×4): qty 50

## 2020-03-20 SURGICAL SUPPLY — 30 items
DRAPE LAP W/FLUID (DRAPES) ×4 IMPLANT
DRAPE UNDER BUTTOCK W/FLU (DRAPES) ×8 IMPLANT
DRSG OPSITE POSTOP 4X8 (GAUZE/BANDAGES/DRESSINGS) ×4 IMPLANT
ELECT CAUTERY BLADE TIP 2.5 (TIP) ×4
ELECTRODE CAUTERY BLDE TIP 2.5 (TIP) ×2 IMPLANT
GLOVE BIOGEL PI IND STRL 6.5 (GLOVE) ×4 IMPLANT
GLOVE BIOGEL PI INDICATOR 6.5 (GLOVE) ×4
GLOVE SURG SYN 6.5 ES PF (GLOVE) ×36 IMPLANT
GOWN STRL REUS W/ TWL LRG LVL3 (GOWN DISPOSABLE) ×10 IMPLANT
GOWN STRL REUS W/TWL LRG LVL3 (GOWN DISPOSABLE) ×10
NS IRRIG 500ML POUR BTL (IV SOLUTION) ×4 IMPLANT
PACK BASIN MAJOR (MISCELLANEOUS) ×4 IMPLANT
PACK DNC HYST (MISCELLANEOUS) ×4 IMPLANT
PAD OB MATERNITY 4.3X12.25 (PERSONAL CARE ITEMS) ×4 IMPLANT
PAD PREP 24X41 OB/GYN DISP (PERSONAL CARE ITEMS) ×4 IMPLANT
SLEEVE PROTECTION STRL DISP (MISCELLANEOUS) ×24 IMPLANT
SPONGE LAP 18X18 RF (DISPOSABLE) ×16 IMPLANT
STRAP SAFETY 5IN WIDE (MISCELLANEOUS) ×4 IMPLANT
SUT MAXON ABS #0 GS21 30IN (SUTURE) ×8 IMPLANT
SUT MNCRL 4-0 (SUTURE) ×2
SUT MNCRL 4-0 27XMFL (SUTURE) ×2
SUT PLAIN GUT 2-0 30 C14 SG823 (SUTURE) ×4
SUT VIC AB 0 CT1 27 (SUTURE) ×2
SUT VIC AB 0 CT1 27XCR 8 STRN (SUTURE) ×2 IMPLANT
SUT VIC AB 0 CT1 36 (SUTURE) ×8 IMPLANT
SUT VIC AB 2-0 CT1 (SUTURE) ×8 IMPLANT
SUT VICRYL 0 TIES 12 18 (SUTURE) ×4 IMPLANT
SUTURE MNCRL 4-0 27XMF (SUTURE) ×2 IMPLANT
SUTURE PLN GUT2-0 30 C14 SG823 (SUTURE) ×2 IMPLANT
TOWEL OR 17X26 4PK STRL BLUE (TOWEL DISPOSABLE) ×4 IMPLANT

## 2020-03-20 NOTE — Progress Notes (Signed)
Talked with Dr. Jerene Pitch around 2015 about drop in hemoglobin from 7.2 at 0550 to 6.3 at 1911. Decision was made to infuse 2 units of PRBC to pt and then recheck labs in the morning.  Notified Dr. Jerene Pitch also at 2015 about foley catheter still being in place and plan for that to be discontinued at 0600. Decision was made to leave in until further notification.   1st unit of PRBC started at 2205 and completed at 2344. Pt had no reactions and tolerated the transfusion well.   Ultrasound ordered and pt sent down around 0145 for this procedure.  Planned to give pt 2nd unit of PRBC and completed all pre-blood tasks. Upon going down to blood bank to pick up blood, was notified that the type and screen expired at 0001. Put in order to to redraw type and screen and called to notify Dr. Jerene Pitch to reorder 1 unit of PRBC.   Dr. Jerene Pitch requested that with the findings of the ultrasound, she would like to have an MRI completed prior to pt receiving 2nd unit of PRBC. Called and got this planned with MRI downstairs.   Pt states she is feeling like she has more energy already after the 1st blood transfusion but still needs assistance sometimes getting out of bed and to the restroom. Foley catheter is still in place and will remain (NOT be removed at 0600) per Dr. Jerene Pitch. Pt's urinary output yellow and clear throughout the night and at least 111mL/hr. Pt has had 3 bowel movements throughout the night. Fundus feels slightly different (hard to explain) but still firm and about U/1. Bleeding has remained small with no clots passed throughout the night. Still using ice packs, Dermaplast, witch-hazel pads, and dibucaine.   Pt's pain has been well-controlled throughout the night with motrin and tylenol.

## 2020-03-20 NOTE — Progress Notes (Signed)
Spoke with radiologist Dr Desmond Lope to determine MRI protocol to be scanned, she recommended patient to have ultrasound prior to MRI after reviewing the CT images. She stated after ultrasound is done they will determine if MRI is still necessary and if so which protocol needs to be scanned. RN informed

## 2020-03-20 NOTE — Progress Notes (Signed)
I spoke with Jocelyn Baldwin and her husband this evening close to midnight. [facility] is doing well. Jocelyn Baldwin is feeling better since beginning the blood transfusion. She was just able to have a normal bowel movement. She past a large blood clot this evening. She had a difficult delivery and we discussed that. She reported that it took 20 minutes for the placenta to delivery and that Dr. Tiburcio Pea had to apply traction and use hemostats. Discussed that this is within the normal time line for placental detachment. She was then taken to the OR for repair of a complicated vaginal laceration. Visualization of the cervix was difficult with two OB/GYNs present.  Previous CT was not conclusive for retained products of conception. Given ongoing bleeding and pelvic trauma with delivery will obtain pelvic US and pelvic MRI to help further characterize pelvic anatomy. Perineum inspected. Minimal swelling. Perineum appears intact, although exam limited secondary to maternal discomfort.  Patient had urinary retention and failed voiding trial twice with significant bladder distension. Will leave foley in place for 24 hours then initiate bladder training before removal of the foley. Patient reports concern regarding seeing air in the tube which increases if she bears down or when she had the bowel movement. She showed me what this looked like and I assured her that this was the normal appearance of air and urine in the tube as it drains. Husband asked me if there was anything worse going on that I was not telling them about since I was ordering lots of imaging. I assured them that this was not the case and that I would be forthcoming with information. My biggest current concern was regarding the possibility of retained products or pelvic trauma.  Jocelyn Idler MD [facility], [facility] 03/20/2020 3:08 AM

## 2020-03-20 NOTE — Discharge Instructions (Signed)

## 2020-03-20 NOTE — Anesthesia Procedure Notes (Signed)
Arterial Line Insertion Start/End4/30/2021 9:45 AM, 03/20/2020 9:55 AM Performed by: [name], MD, Karoline Caldwell, CRNA, anesthesiologist  Patient location: Pre-op. Preanesthetic checklist: patient identified, IV checked, site marked, risks and benefits discussed, surgical consent, monitors and equipment checked, pre-op evaluation, timeout performed and anesthesia consent Lidocaine 1% used for infiltration Right, radial was placed Catheter size: 20 Fr Hand hygiene performed  and maximum sterile barriers used   Attempts: 3 Procedure performed without using ultrasound guided technique. Following insertion, dressing applied. Post procedure assessment: normal and unchanged  Patient tolerated the procedure well with no immediate complications.

## 2020-03-20 NOTE — Consult Note (Signed)
Pharmacy Antibiotic Note  Jocelyn Baldwin is a 24 y.o. female admitted on 03/16/2020 with Uterus/chorioamnionitis.  Pharmacy has been consulted for Gentamicin dosing. Patient received gentamicin 480 mg x1 dose on 4/30 @ 0818  Plan: 1. Will gentamicin 1.5 mg/kg Q8H as dicussed with Dr. Bonney Aid. S/p delivery, weight has been rechecked and it is 95.3 kg as of 1300 today.   Height: 5\' 6"  (167.6 cm) Weight: 96 kg (211 lb 10.3 oz) IBW/kg (Calculated) : 59.3  Temp (24hrs), Avg:98.3 F (36.8 C), Min:97.5 F (36.4 C), Max:98.9 F (37.2 C)  Recent Labs  Lab 03/18/20 0227 03/18/20 0641 03/18/20 1845 03/19/20 0550 03/19/20 1911 03/20/20 0344 03/20/20 1000  WBC 20.9*   < > 16.5* 16.9* 15.0* 13.3* 25.9*  CREATININE 0.92  --   --   --   --   --   --    < > = values in this interval not displayed.    Estimated Creatinine Clearance: 110.2 mL/min (by C-G formula based on SCr of 0.92 mg/dL).    Allergies  Allergen Reactions  . Sulfa Antibiotics Anaphylaxis  . Amoxicillin Hives  . Latex Itching and Rash  . Penicillins Hives    Antimicrobials this admission: 4/30 Clindamycin >>  4/30 Gentamicin >>  Microbiology results: 4/28 COVID : negative  Thank you for allowing pharmacy to be a part of this patient's care.  5/28 03/20/2020 12:07 PM

## 2020-03-20 NOTE — Progress Notes (Signed)
Spoke with patient regarding operative course with failure to manually reduce the uterine inversion vaginally or abdominally despite attempts at achieving uterine relaxation with sevoflurane, magnesium, terbutaline, and nitroglycerine.   Given concern for worsening hemodynamic instability as well as potential need to proceed with hysterectomy if Haultain procedure was unsuccessful it was deemed that it was safer to abort the procedure and seek tertiary level care.  Ideally it was felt that central line access, as well at IR placement of uterine artery catheters for tamponade would be prudent before proceeding.  Given the degree of distortion of the ureters with the inversion ureteral catheters might also need to be considered should a hysterectomy need to be undertaken after unsuccessful Haultain procedure.    [facility] transfer center was contacted and I spoke with Dr. Kae Heller.  However, there was no STICU bed availability currently with no estimated on when there would be availability.    [facility] transfer center contacted as well as Dr. Leatha Gilding who were looking into bed availability and facilitating transfer.    Dr. Bjorn Pippin and myself also discussed the case with the OB/GYN service line director Dr. Erin Fulling who was in agreement that [facility] lacked the gyn-oncology availability that might be required should a hysterectomy be required.    Will attempt to contact [facility] and [facility] if inability to secure placement at [facility].

## 2020-03-20 NOTE — Transfer of Care (Signed)
Immediate Anesthesia Transfer of Care Note  Patient: Jocelyn Baldwin  Procedure(s) Performed: EXAM UNDER ANESTHESIA W/REPLACEMENT OF UTERUS, POSSIBLE LAPAROTOMY (N/A )  Patient Location: PACU  Anesthesia Type:General  Level of Consciousness: awake, alert  and oriented  Airway & Oxygen Therapy: Patient Spontanous Breathing and Patient connected to face mask oxygen  Post-op Assessment: Report given to RN and Post -op Vital signs reviewed and stable  Post vital signs: Reviewed and stable  Last Vitals:  Vitals Value Taken Time  BP 121/72 03/20/20 1055  Temp 36.4 C 03/20/20 1045  Pulse 121 03/20/20 1103  Resp 15 03/20/20 1103  SpO2 100 % 03/20/20 1103  Vitals shown include unvalidated device data.  Last Pain:  Vitals:   03/20/20 0703  TempSrc: Temporal  PainSc: 3          Complications: No apparent anesthesia complications

## 2020-03-20 NOTE — Anesthesia Preprocedure Evaluation (Signed)
Anesthesia Evaluation  Patient identified by MRN, date of birth, ID band Patient awake    Reviewed: Allergy & Precautions, NPO status , Patient's Chart, lab work & pertinent test results  History of Anesthesia Complications Negative for: history of anesthetic complications  Airway Mallampati: II  TM Distance: >3 FB Neck ROM: Full    Dental no notable dental hx. (+) Teeth Intact, Dental Advisory Given   Pulmonary neg pulmonary ROS, neg sleep apnea, neg COPD, Patient abstained from smoking.Not current smoker,    Pulmonary exam normal breath sounds clear to auscultation       Cardiovascular Exercise Tolerance: Good METS(-) hypertension(-) CAD and (-) Past MI negative cardio ROS  (-) dysrhythmias  Rhythm:Regular Rate:Tachycardia - Systolic murmurs    Neuro/Psych negative neurological ROS  negative psych ROS   GI/Hepatic neg GERD  ,(+)     (-) substance abuse  ,   Endo/Other  neg diabetes  Renal/GU negative Renal ROS     Musculoskeletal   Abdominal   Peds  Hematology   Anesthesia Other Findings History reviewed. No pertinent past medical history.  Reproductive/Obstetrics                             Anesthesia Physical  Anesthesia Plan  ASA: II and emergent  Anesthesia Plan: General   Post-op Pain Management:    Induction: Intravenous  PONV Risk Score and Plan: 3 and Ondansetron, Dexamethasone, Midazolam, Promethazine and Treatment may vary due to age or medical condition  Airway Management Planned: Oral ETT  Additional Equipment: None  Intra-op Plan:   Post-operative Plan: Extubation in OR  Informed Consent: I have reviewed the patients History and Physical, chart, labs and discussed the procedure including the risks, benefits and alternatives for the proposed anesthesia with the patient or authorized representative who has indicated his/her understanding and acceptance.      Dental advisory given  Plan Discussed with: CRNA and Surgeon  Anesthesia Plan Comments:         Anesthesia Quick Evaluation

## 2020-03-20 NOTE — Progress Notes (Signed)
Discussed results of MRI with patient.  MRI shows subacute uterine inversion.  Discussed management with the patient.  Will take the patient to the OR for exam under anesthesia to attempt to gently replace the uterus vaginally.  Would then likely place bakri balloon,  if possible, to retain the shape and elevate the uterus into the abdominal cavity.  Discussed with the patient that this is a unique scenario.  That she would need uterine relaxing agents followed by uterine contractile agents to assist with this procedure.  Discussed that if replacement of the uterus vaginally was not possible she would need a laparotomy to assist in replacement of the uterine fundus.  Discussed the possibility of contractile sutures if there is poor uterine tone as well as the unlikely possibility of a hysterectomy if having heavy bleeding or hemorrhage was associated and not able to be controlled. Discussed the risks of hemorrhage as well as infection.  Patient has received 1 unit of blood overnight and has a second unit of blood pending.  All questions that the patient and her husband had were answered.  We will plan to proceed urgently to the OR this morning.  Will make patient n.p.o.  Last she last ate dinner at 8 PM.  She has had small sips of water since that time.  Adelene Idler MD [facility], [facility] 03/20/2020 6:28 AM

## 2020-03-20 NOTE — Anesthesia Procedure Notes (Signed)
Procedure Name: Intubation Date/Time: 03/20/2020 7:59 AM Performed by: Henrietta Hoover, CRNA Pre-anesthesia Checklist: Patient identified, Patient being monitored, Timeout performed, Emergency Drugs available and Suction available Patient Re-evaluated:Patient Re-evaluated prior to induction Oxygen Delivery Method: Circle system utilized Preoxygenation: Pre-oxygenation with 100% oxygen Induction Type: IV induction Ventilation: Mask ventilation without difficulty Laryngoscope Size: 3 and McGraph Grade View: Grade I Tube type: Oral Tube size: 7.0 mm Number of attempts: 1 Airway Equipment and Method: Stylet Placement Confirmation: ETT inserted through vocal cords under direct vision,  positive ETCO2 and breath sounds checked- equal and bilateral Secured at: 21 cm Tube secured with: Tape Dental Injury: Teeth and Oropharynx as per pre-operative assessment

## 2020-03-20 NOTE — Progress Notes (Signed)
   03/20/20 1045  Clinical Encounter Type  Visited With Family  Visit Type Initial  Referral From Nurse  Consult/Referral To Chaplain  While rounding around 10 am chaplain was made aware of the fact that Patient was in surgery and things were not looking good. Nurse asked chaplain to be available within the next twenty minutes when doctor explains everything to family. Chaplain was in the room for support. Patient's mother was noticeably upset and she stormed out of the room. Shortly after doctors finished explaining their options, patient's husband asked nurse and chaplain to leave. Chaplain prayed silently the entire time she was in the room.   Chaplain later walked by patient's room in ICU and saw her sitting up. Chaplain will continue praying for patient and her family.

## 2020-03-20 NOTE — Op Note (Signed)
Operative Note  03/20/2020  PRE-OP DIAGNOSIS: Uterine Inversion  POST-OP DIAGNOSIS: Uterien Inversion   PROCEDURE: Procedure(s): EXAM UNDER ANESTHESIA W/REPLACEMENT OF UTERUS, LAPAROTOMY   SURGEON: Adelene Idler MD  ASSISTANT: Vena Austria MD  ANESTHESIA: General  ESTIMATED BLOOD LOSS: 2500 cc  SPECIMENS:  None  COMPLICATIONS: Hemorrhage, failure to replace the uterus  DISPOSITION: PACU - hemodynamically stable.  CONDITION: stable  FINDINGS: Exam under anesthesia revealed vaginal mass consistent with the uterine fundus. The  Laparotomy incision showed a complete inversion of the uterus. Uterine tissue appeared well perfused and viable.  PROCEDURE IN DETAIL: After informed consent was obtained, the patient was taken to the operating room where general endotracheal anesthesia was performed without difficulty. The patient was positioned in the supine position and the usual precautions taken with her arms in arm boards bilaterally.She was prepped and draped in normal sterile fashion. Time-out was performed and a Foley catheter was maintained in the bladder.  Exam under anesthesia was performed. The uterine fundus was able to be palpated vaginally. She was given IV nitroglycerine, IM terbutaline, Magnesium bolus, ans sevoflurane gas. After these medication were administered manual replacement of the uterine fundus was attempted.  After unsuccessful attempts by Dr. Bonney Aid and myself the decision was made to proceed with a laparotomy to attempt a Huntington procedure.   A transverse incision incision was made and carried down to the underlying fascia. The fascia was dissected bilaterally using mayo scissors, and then the rectus fascia was dissected away from the muscle and separated in the midline. The peritoneum was then entered. Bladder is inferiorally retracted and the bowel was freed up and packed gently  and a self retaining abdominal retractor was then placed.  The  inverted uterus was visualized.  There was a visible donut of cervix with a central indentation . The bilateral fallopian tubes and ovaries were normal and touching in the midline.  The round ligaments were identified on the left and right side.  Allis clamps were used to try to walk along the round ligaments to evert the uterine fundus.  However attempts to do this were not successful.  Dr. Bonney Aid and I both attempted an assisted hand from below to again try and evert the uterus.  These attempts were again not successful.  An EEA sizer was used to apply pressure to the uterine fundus to attempt the eversion.  Again these attempts were unsuccessful.  After trying for close to 3 hours to evert the uterus with no success a discussion was had as to whether to proceed with the Haultain procedure.  At this point the patient had experienced significant blood loss and was becoming acutely unstable.  It was decided that it would be in the best interest of the patient and the safest for the procedure to be concluded so that an attempt could be made at a tertiary care center.  The thought was that assistance from urology for ureteral, gynecological oncology, and interventional radiology would be valuable in the setting of potential hysterectomy and these resources are not readily available at our institution.  At this time the laparotomy packing sponges were removed from the abdomen.  The self-retaining retractor was removed.  The fascia was closed with 1 Maxon in a running fashion.  The subcutaneous fat was closed with 2-0 plain gut in a running fashion.  And the skin was closed with 4-0 Vicryl with a subcuticular stitch.  Steri-Strips and a dressing were applied.  The  vagina was inspected.  The  patient's laceration from her obstetrical delivery had reopened with manipulation from the procedure.  The decision was made to pack the vagina with 1 laparotomy sponge but to leave the vaginal laceration unrepaired since a  repeat procedure would be imminent.  The patient was taken to the PACU in stable condition.  She was then transferred to the ICU for further care.  Arrangements for transfer are ongoing.  Dr. Georgianne Fick assisted in all components of this case and a surgery requiring a highly skilled assistant.  Adrian Prows MD [facility], [facility] 03/20/2020  11:17 AM

## 2020-03-20 NOTE — Progress Notes (Signed)
Patient transported to [facility] via [facility]. Report called to Tobi Bastos RN and Jasmine December RN at [place]. Patient to go to [facility]. Patient and family aware of transfer. 5th unit of PRBCs started prior to Air [facility] arrival but not complete by the time air care arrived so sent with air care. All belongings sent with patient or family.

## 2020-03-21 MED ORDER — ACETAMINOPHEN 325 MG PO TABS
650.00 | ORAL_TABLET | ORAL | Status: DC
Start: 2020-03-21 — End: 2020-03-21

## 2020-03-21 MED ORDER — GENERIC EXTERNAL MEDICATION
Status: DC
Start: ? — End: 2020-03-21

## 2020-03-21 MED ORDER — SENNOSIDES-DOCUSATE SODIUM 8.6-50 MG PO TABS
2.00 | ORAL_TABLET | ORAL | Status: DC
Start: 2020-03-26 — End: 2020-03-21

## 2020-03-21 MED ORDER — POLYETHYLENE GLYCOL 3350 17 GM/SCOOP PO POWD
17.00 | ORAL | Status: DC
Start: 2020-03-26 — End: 2020-03-21

## 2020-03-21 MED ORDER — GENERIC EXTERNAL MEDICATION
1.00 | Status: DC
Start: 2020-03-21 — End: 2020-03-21

## 2020-03-21 MED ORDER — METHYLERGONOVINE MALEATE 0.2 MG PO TABS
0.20 | ORAL_TABLET | ORAL | Status: DC
Start: 2020-03-21 — End: 2020-03-21

## 2020-03-21 MED ORDER — KETOROLAC TROMETHAMINE 15 MG/ML IJ SOLN
15.00 | INTRAMUSCULAR | Status: DC
Start: 2020-03-21 — End: 2020-03-21

## 2020-03-21 MED ORDER — GENERIC EXTERNAL MEDICATION
900.00 | Status: DC
Start: 2020-03-21 — End: 2020-03-21

## 2020-03-21 MED ORDER — POTASSIUM CHLORIDE 20 MEQ/100ML IV SOLN
20.00 | INTRAVENOUS | Status: DC
Start: 2020-03-21 — End: 2020-03-21

## 2020-03-22 LAB — BPAM RBC
Blood Product Expiration Date: 202105302359
Blood Product Expiration Date: 202106012359
Blood Product Expiration Date: 202106022359
Blood Product Expiration Date: 202106022359
Blood Product Expiration Date: 202106022359
Blood Product Expiration Date: 202106032359
Blood Product Expiration Date: 202106032359
Blood Product Expiration Date: 202106032359
Blood Product Expiration Date: 202106052359
Blood Product Expiration Date: 202106052359
Blood Product Expiration Date: 202106052359
Blood Product Expiration Date: 202106052359
ISSUE DATE / TIME: 202104300745
ISSUE DATE / TIME: 202104300745
ISSUE DATE / TIME: 202104300745
ISSUE DATE / TIME: 202104300922
ISSUE DATE / TIME: 202104300922
ISSUE DATE / TIME: 202104300922
ISSUE DATE / TIME: 202104300922
ISSUE DATE / TIME: 202104301621
Unit Type and Rh: 5100
Unit Type and Rh: 5100
Unit Type and Rh: 5100
Unit Type and Rh: 5100
Unit Type and Rh: 5100
Unit Type and Rh: 5100
Unit Type and Rh: 5100
Unit Type and Rh: 5100
Unit Type and Rh: 5100
Unit Type and Rh: 5100
Unit Type and Rh: 5100
Unit Type and Rh: 5100

## 2020-03-22 LAB — TYPE AND SCREEN
ABO/RH(D): O POS
Antibody Screen: NEGATIVE
Unit division: 0
Unit division: 0
Unit division: 0
Unit division: 0
Unit division: 0
Unit division: 0
Unit division: 0
Unit division: 0
Unit division: 0
Unit division: 0
Unit division: 0
Unit division: 0

## 2020-03-22 LAB — PREPARE RBC (CROSSMATCH)

## 2020-03-23 MED ORDER — LACTATED RINGERS IV SOLN
INTRAVENOUS | Status: DC
Start: ? — End: 2020-03-23

## 2020-03-23 MED ORDER — ENOXAPARIN SODIUM 40 MG/0.4ML ~~LOC~~ SOLN
40.00 | SUBCUTANEOUS | Status: DC
Start: 2020-03-26 — End: 2020-03-23

## 2020-03-23 MED ORDER — IBUPROFEN 600 MG PO TABS
600.00 | ORAL_TABLET | ORAL | Status: DC
Start: 2020-03-26 — End: 2020-03-23

## 2020-03-23 MED ORDER — SIMETHICONE 80 MG PO CHEW
80.00 | CHEWABLE_TABLET | ORAL | Status: DC
Start: ? — End: 2020-03-23

## 2020-03-23 MED ORDER — ACETAMINOPHEN 325 MG PO TABS
975.00 | ORAL_TABLET | ORAL | Status: DC
Start: 2020-03-23 — End: 2020-03-23

## 2020-03-23 MED ORDER — OXYCODONE HCL 5 MG PO TABS
5.00 | ORAL_TABLET | ORAL | Status: DC
Start: ? — End: 2020-03-23

## 2020-03-23 MED ORDER — MELATONIN 3 MG PO TBDP
3.00 | ORAL_TABLET | ORAL | Status: DC
Start: 2020-03-25 — End: 2020-03-23

## 2020-03-23 MED ORDER — ERTAPENEM SODIUM 1 G IJ SOLR
1.00 | INTRAMUSCULAR | Status: DC
Start: 2020-03-24 — End: 2020-03-23

## 2020-03-23 NOTE — Anesthesia Postprocedure Evaluation (Signed)
Anesthesia Post Note  Patient: Jocelyn Baldwin  Procedure(s) Performed: EXPLORATORY LAPAROTOMY (N/A Abdomen)  Patient location during evaluation: PACU Anesthesia Type: General Level of consciousness: awake and alert Pain management: pain level controlled Vital Signs Assessment: post-procedure vital signs reviewed and stable Respiratory status: spontaneous breathing, nonlabored ventilation, respiratory function stable and patient connected to nasal cannula oxygen Cardiovascular status: blood pressure returned to baseline and stable Postop Assessment: no apparent nausea or vomiting Anesthetic complications: no     Last Vitals:  Vitals:   03/20/20 1630 03/20/20 1645  BP:  (!) 146/76  Pulse: (!) 119 (!) 107  Resp: (!) 22 20  Temp: 36.9 C 36.9 C  SpO2: 100% 100%    Last Pain:  Vitals:   03/20/20 1645  TempSrc: Oral  PainSc:                  [name]

## 2020-03-24 MED ORDER — ACETAMINOPHEN 325 MG PO TABS
975.00 | ORAL_TABLET | ORAL | Status: DC
Start: 2020-03-26 — End: 2020-03-24

## 2020-03-24 MED ORDER — FERROUS SULFATE 324 MG PO TBEC
324.00 | DELAYED_RELEASE_TABLET | ORAL | Status: DC
Start: 2020-03-26 — End: 2020-03-24

## 2020-03-24 MED ORDER — CEFDINIR 300 MG PO CAPS
300.00 | ORAL_CAPSULE | ORAL | Status: DC
Start: 2020-03-25 — End: 2020-03-24

## 2020-03-24 MED ORDER — METRONIDAZOLE 250 MG PO TABS
500.00 | ORAL_TABLET | ORAL | Status: DC
Start: 2020-03-25 — End: 2020-03-24

## 2020-03-25 LAB — BPAM FFP
Blood Product Expiration Date: 202105052359
Blood Product Expiration Date: 202105052359
Blood Product Expiration Date: 202105052359
ISSUE DATE / TIME: 202104300939
ISSUE DATE / TIME: 202104301027
ISSUE DATE / TIME: 202104301203
Unit Type and Rh: 5100
Unit Type and Rh: 5100
Unit Type and Rh: 8400

## 2020-03-25 LAB — PREPARE PLATELET PHERESIS
Unit division: 0
Unit division: 0
Unit division: 0

## 2020-03-25 LAB — BPAM PLATELET PHERESIS
Blood Product Expiration Date: 202105012359
Blood Product Expiration Date: 202105032359
Blood Product Expiration Date: 202105032359
ISSUE DATE / TIME: 202104300934
ISSUE DATE / TIME: 202104301018
ISSUE DATE / TIME: 202104301359
Unit Type and Rh: 5100
Unit Type and Rh: 5100
Unit Type and Rh: 6200

## 2020-03-25 LAB — PREPARE FRESH FROZEN PLASMA
Unit division: 0
Unit division: 0

## 2020-03-27 MED ORDER — DIPHENHYDRAMINE HCL 25 MG PO CAPS
25.00 | ORAL_CAPSULE | ORAL | Status: DC
Start: ? — End: 2020-03-27

## 2020-05-13 IMAGING — CT CT ABD-PELV W/ CM
2 of 5 series · 15 of 46 positions shown, 17 images · IV contrast (omnipaque)
Comparison: Pelvic ultrasound of 03/19/2018 from [facility]HOSPITAL[facility]BG[facility].

CLINICAL DATA: Postpartum hemorrhage, gestational thrombocytopenia,
status post blood transfusions.

EXAM:
CT ABDOMEN AND PELVIS WITH CONTRAST
TECHNIQUE: Multidetector CT imaging of the abdomen and pelvis was performed
using the standard protocol following bolus administration of
intravenous contrast.
CONTRAST:  100mL OMNIPAQUE IOHEXOL 300 MG/ML  SOLN

[Series 2: routine abd/pel with · axial · 0.78mm/px · z∈[-1313,-858]mm · 12 of 106 slices shown, 14 images]
[im 8/106  soft-tissue]
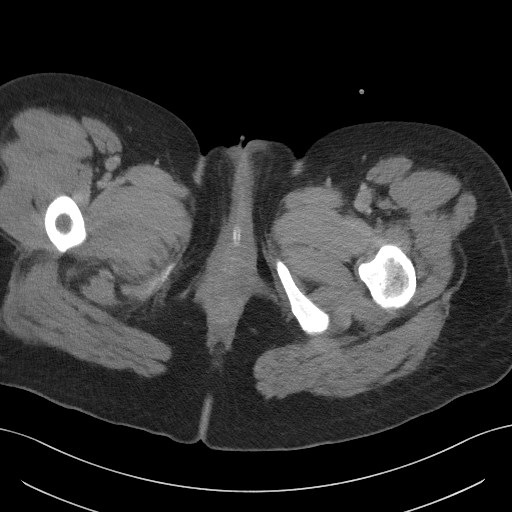
[im 8/106  bone]
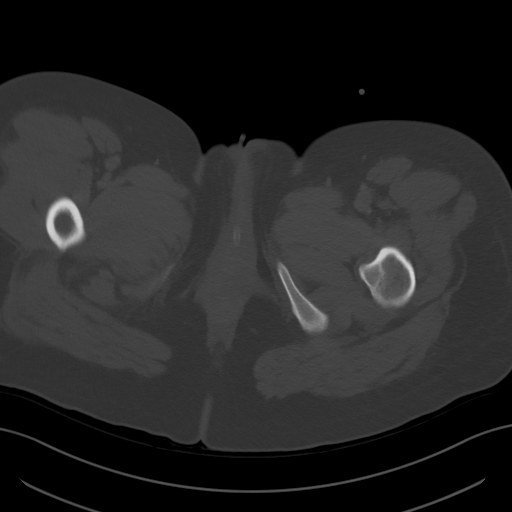
[im 15/106  soft-tissue]
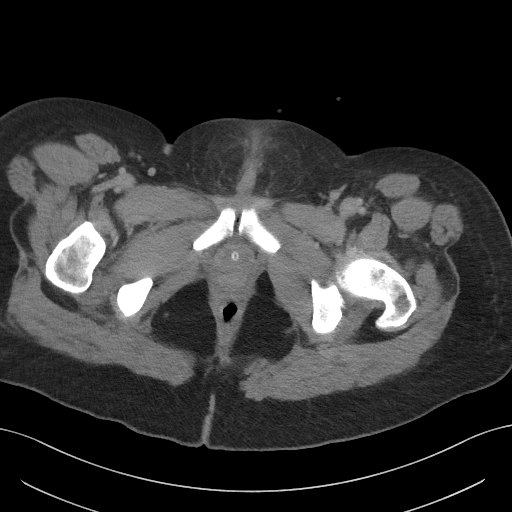
[im 22/106  soft-tissue]
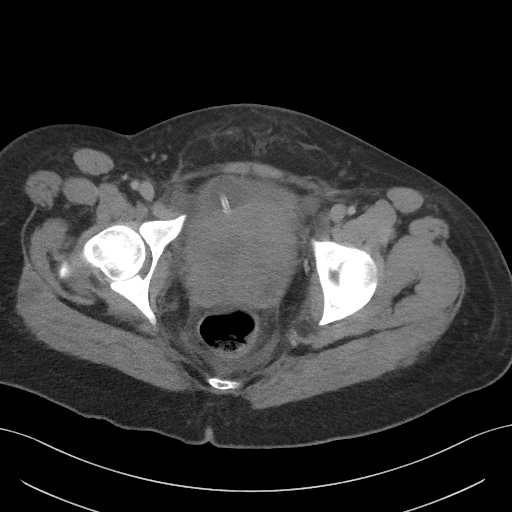
[im 36/106  soft-tissue]
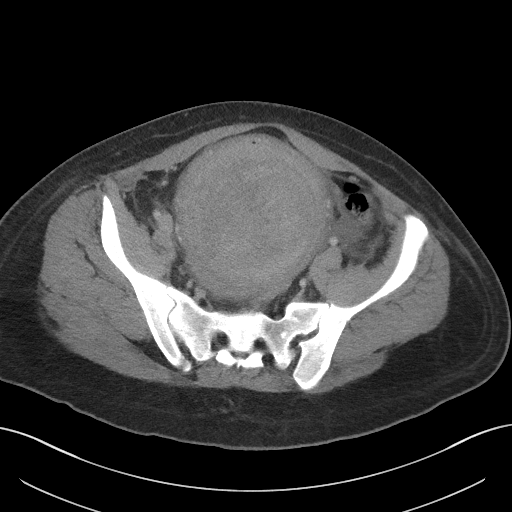
[im 43/106  soft-tissue]
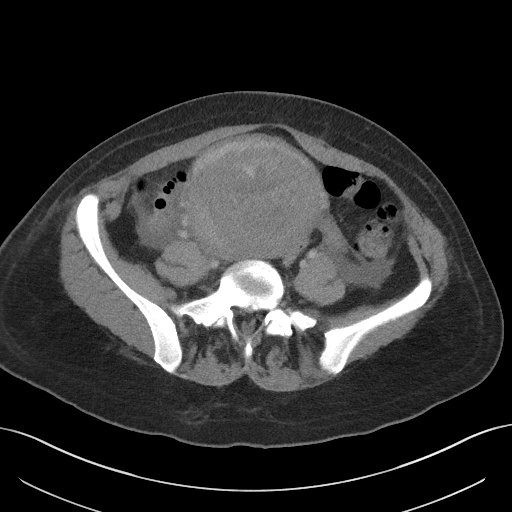
[im 50/106  soft-tissue]
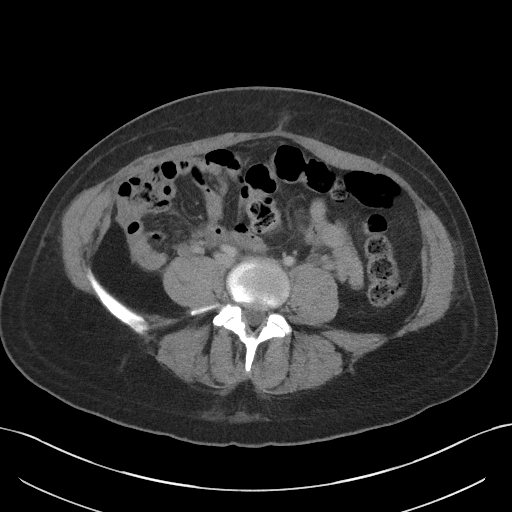
[im 57/106  soft-tissue]
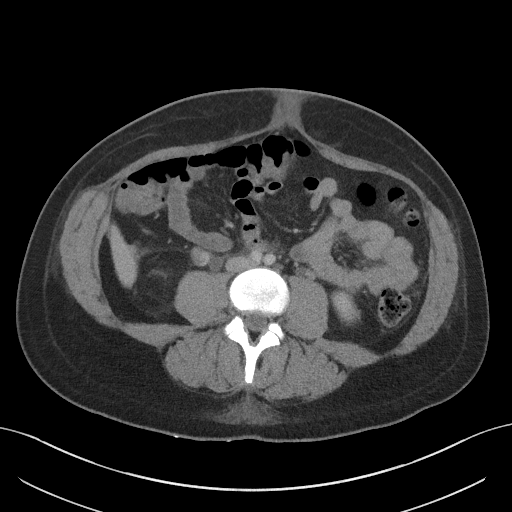
[im 64/106  soft-tissue]
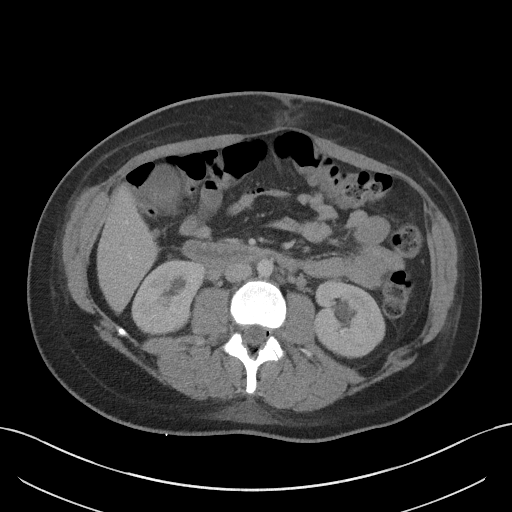
[im 71/106  soft-tissue]
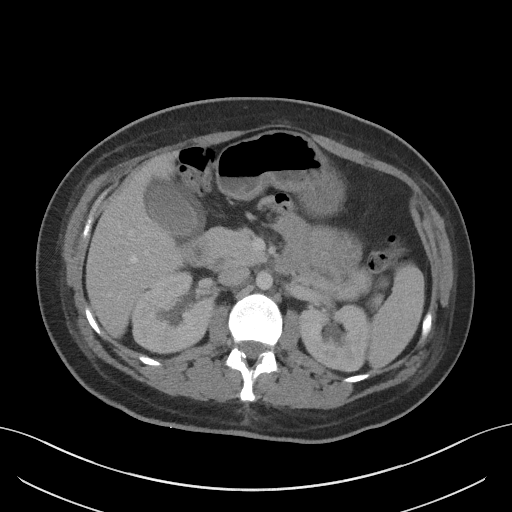
[im 71/106  bone]
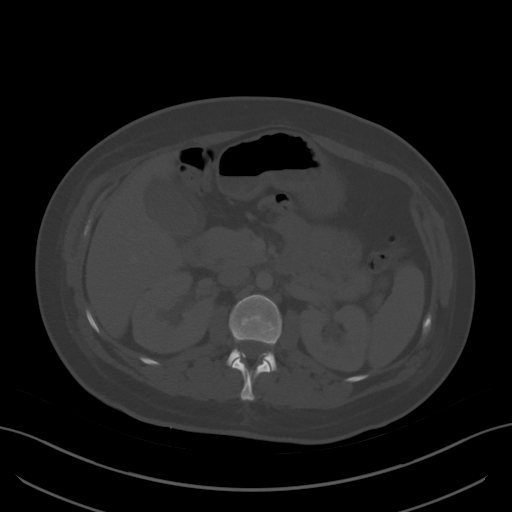
[im 85/106  soft-tissue]
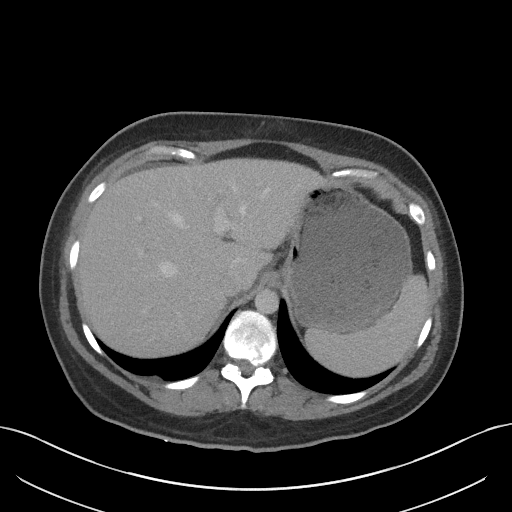
[im 92/106  soft-tissue]
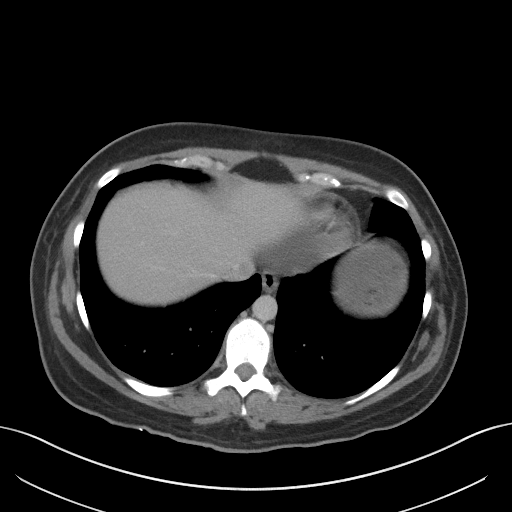
[im 99/106  soft-tissue]
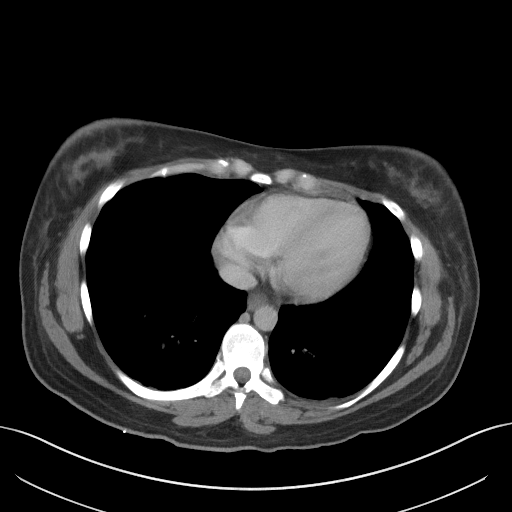

[Series 5: coronal st · coronal · 0.74mm/px · 3 of 101 slices shown]
[im 34/101  soft-tissue]
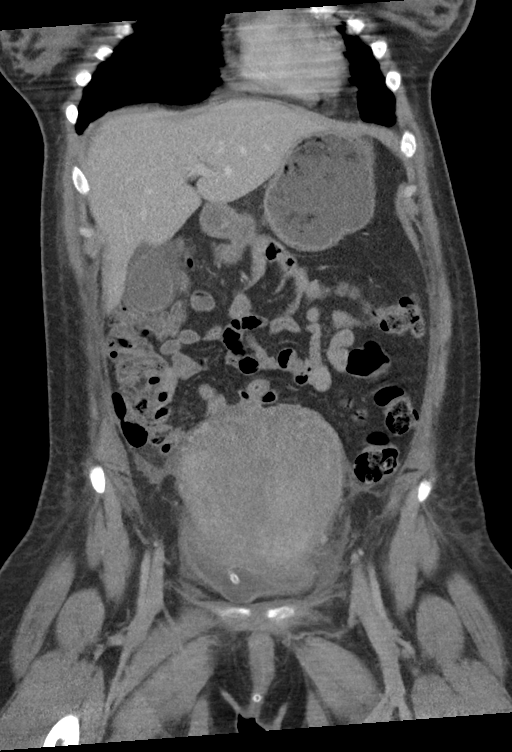
[im 45/101  soft-tissue]
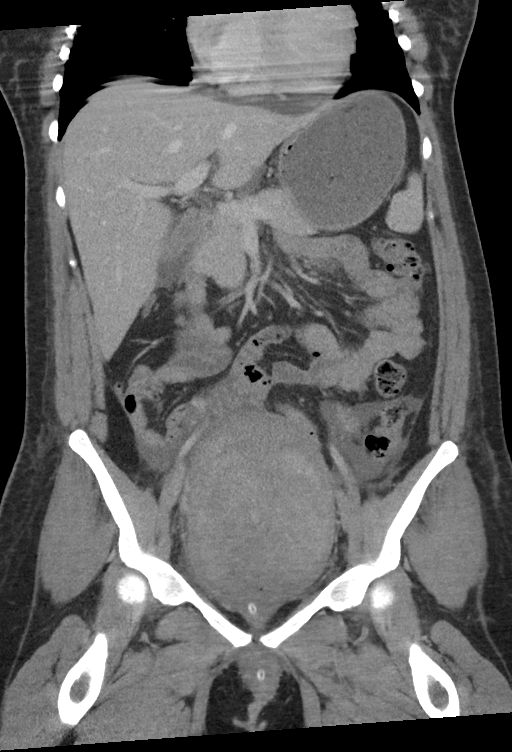
[im 56/101  soft-tissue]
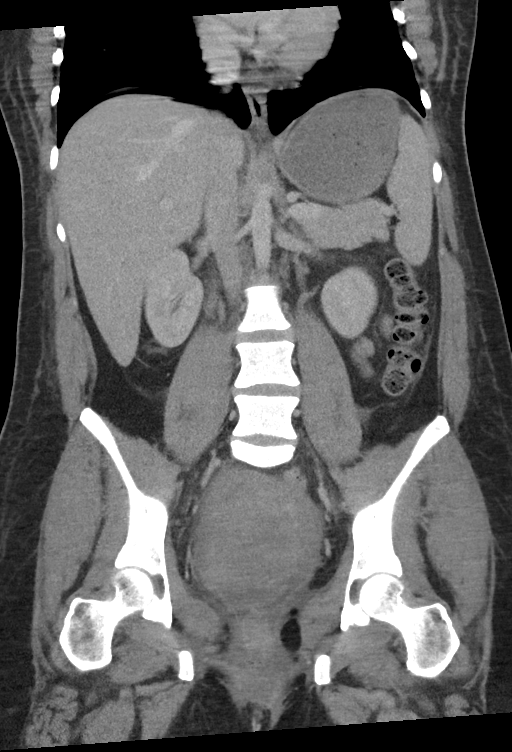

[15 of 46 positions shown; findings below may reference images not displayed]

FINDINGS: Lower chest: Unremarkable

Hepatobiliary: Gallbladder wall thickening at about 8 mm in
thickness. Possible sludge in the gallbladder. The liver appears
normal.

Pancreas: Unremarkable

Spleen: Unremarkable

Adrenals/Urinary Tract: Borderline bilateral hydronephrosis and mild
proximal hydroureter extending to the iliac vessel cross over, where
there is mass effect due to the enlarged uterine contour. There is a
Foley catheter in the urinary bladder which is anteriorly displaced
by the enlarged uterine contour.

Stomach/Bowel: Unremarkable

Vascular/Lymphatic: Unremarkable

Reproductive: There is a masslike appearance along the posterior
margin of the uterus along with heterogeneity of the uterus and a
trace amount of postpartum gas along the endometrium near the
fundus. The uterus including the masslike appearance which could be
from hemorrhage within the endometrial cavity or along the posterior
uterine margin measures 16.0 by 10.3 by 11.9 cm. A considerable
central portion of this volume could be from hematoma or less likely
retained products of conception, ultrasound might help in further
characterization in this regard. I do not see any active
extravasation of contrast within or along the uterus.

Other: Small amount of free pelvic fluid in the paracolic gutters.
Trace fluid in the presacral space.

Musculoskeletal: S1 spina bifida occulta. Trace gas in the epidural
space adjacent to an epidural catheter.
IMPRESSION: 1. Heterogeneous masslike appearance of the uterus. I cannot exclude
endometrial or intramural hematoma or retained products of
conception. The prominence is greater than I would expect in the
normal postpartum setting. Consider sonography to further
characterize and assess for any vascularity. No active extravasation
of contrast identified.
2. Small amount of free pelvic fluid in the paracolic gutters.
3. Nonspecific gallbladder wall thickening. This can be seen in
setting of hypoproteinemia/hypoalbuminemia or in the setting of and
inflammation.
4. Borderline bilateral hydronephrosis and mild proximal hydroureter
bilaterally due to mass effect from the large uterine contour
slightly compressing the ureters at the iliac vessel cross over.
5. Trace amount of gas in the epidural space adjacent to the
epidural catheter, likely benign/incidental.
